# Patient Record
Sex: Female | Born: 1985 | Race: Black or African American | Hispanic: No | Marital: Single | Smoking: Former smoker
Health system: Southern US, Community
[De-identification: ages and names within clinical notes are randomized; demographics above are authoritative.]

## PROBLEM LIST (undated history)

## (undated) ENCOUNTER — Inpatient Hospital Stay (HOSPITAL_COMMUNITY): Payer: Self-pay

## (undated) DIAGNOSIS — R519 Headache, unspecified: Secondary | ICD-10-CM

## (undated) DIAGNOSIS — J45909 Unspecified asthma, uncomplicated: Secondary | ICD-10-CM

## (undated) DIAGNOSIS — E119 Type 2 diabetes mellitus without complications: Secondary | ICD-10-CM

## (undated) DIAGNOSIS — R51 Headache: Secondary | ICD-10-CM

## (undated) DIAGNOSIS — F4310 Post-traumatic stress disorder, unspecified: Secondary | ICD-10-CM

## (undated) HISTORY — PX: LAPAROSCOPIC GASTRIC SLEEVE RESECTION: SHX5895

## (undated) HISTORY — PX: GASTRIC BYPASS: SHX52

---
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
## 2006-04-29 IMAGING — CR NECK SOFT TISSUES - 1+ VIEW
1 series · 2 of 2 positions shown · non-contrast
Comparison: none

REASON FOR EXAM: neck pain/[HOSPITAL]
COMMENTS:

PROCEDURE:     DXR - DXR SOFT TISSUE NECK  - January 04, 2005 [DATE]
RESULT:
AP and lateral views of the neck obtained with soft tissue technique reveal
the epiglottis to be normal in size.  No retropharyngeal soft tissue
swelling is seen.  No foreign body is identified in the cervical airway.

[Series 1: view not recorded · 0.17mm/px · 2 of 2 slices shown]
[im 1/2]
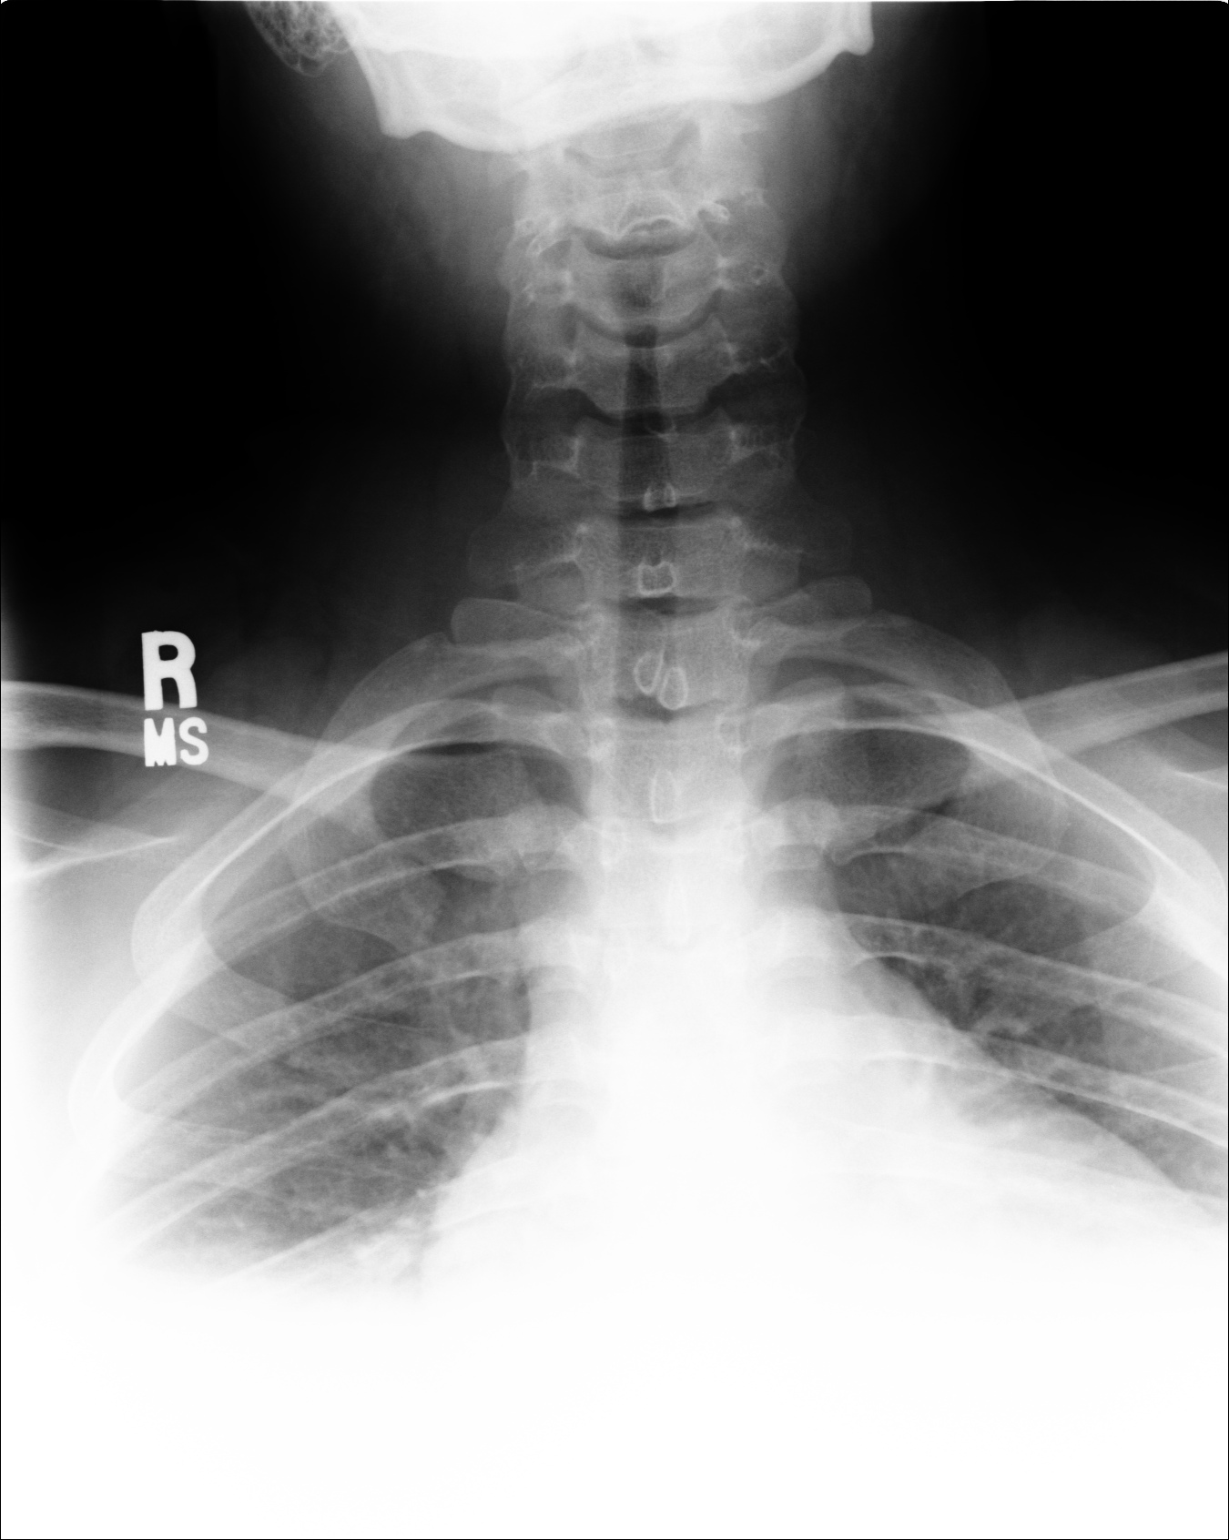
[im 2/2]
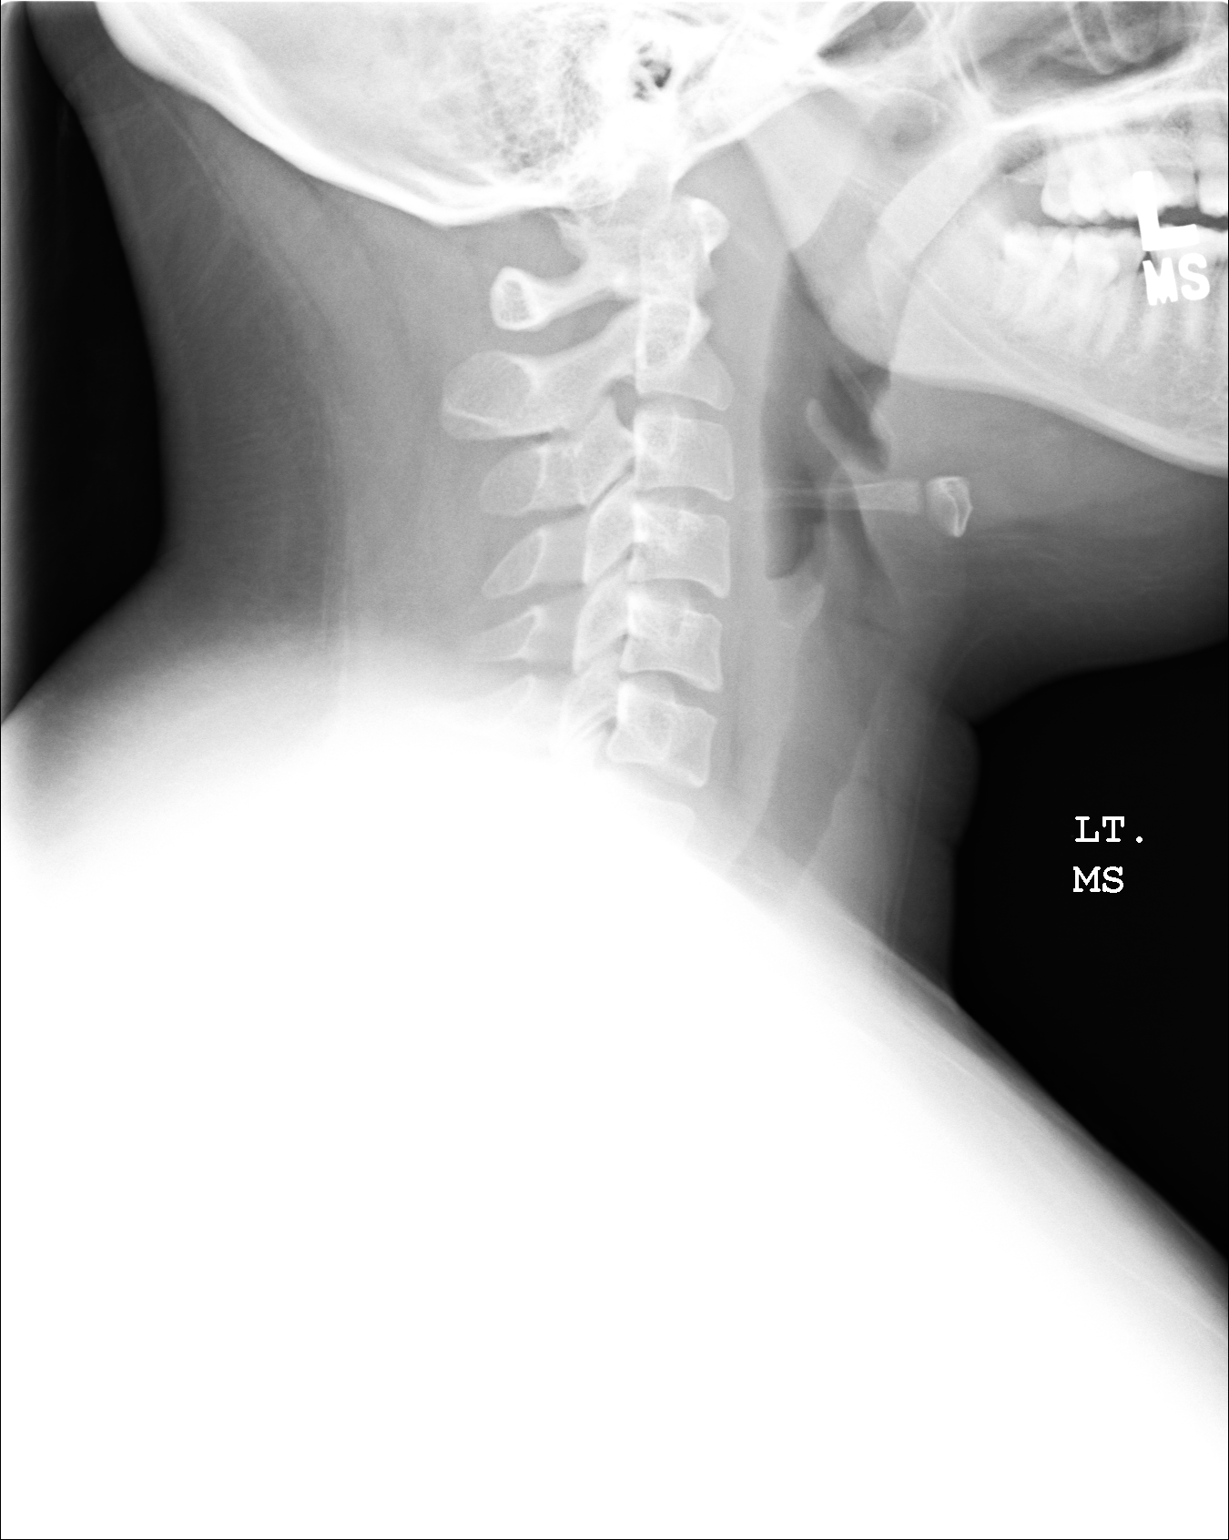

[2 of 2 positions shown; findings below may reference images not displayed]

IMPRESSION: No significant abnormalities noted.

## 2010-04-20 ENCOUNTER — Emergency Department: Payer: Self-pay | Admitting: Emergency Medicine

## 2011-11-24 ENCOUNTER — Emergency Department: Payer: Self-pay | Admitting: Internal Medicine

## 2012-10-16 ENCOUNTER — Emergency Department: Payer: Self-pay | Admitting: Internal Medicine

## 2013-10-06 ENCOUNTER — Ambulatory Visit: Payer: Self-pay | Admitting: Surgery

## 2013-11-04 ENCOUNTER — Ambulatory Visit: Payer: Self-pay | Admitting: Surgery

## 2016-08-14 ENCOUNTER — Emergency Department (HOSPITAL_COMMUNITY)
Admission: EM | Admit: 2016-08-14 | Discharge: 2016-08-14 | Disposition: A | Discharge status: Home / Self Care | Payer: Medicaid Other | Attending: Emergency Medicine | Admitting: Emergency Medicine

## 2016-08-14 ENCOUNTER — Encounter (HOSPITAL_COMMUNITY): Payer: Self-pay

## 2016-08-14 DIAGNOSIS — K0889 Other specified disorders of teeth and supporting structures: Secondary | ICD-10-CM | POA: Diagnosis present

## 2016-08-14 DIAGNOSIS — Z79899 Other long term (current) drug therapy: Secondary | ICD-10-CM | POA: Insufficient documentation

## 2016-08-14 DIAGNOSIS — K029 Dental caries, unspecified: Secondary | ICD-10-CM

## 2016-08-14 DIAGNOSIS — K047 Periapical abscess without sinus: Secondary | ICD-10-CM

## 2016-08-14 HISTORY — DX: Post-traumatic stress disorder, unspecified: F43.10

## 2016-08-14 MED ORDER — IBUPROFEN 600 MG PO TABS: 600.0000 mg | ORAL_TABLET | Freq: Four times a day (QID) | ORAL | 0 refills | Status: DC | PRN

## 2016-08-14 MED ORDER — HYDROCODONE-ACETAMINOPHEN 5-325 MG PO TABS
1.0000 | ORAL_TABLET | Freq: Once | ORAL | Status: AC
  Administered 2016-08-14: 1 via ORAL
  Filled 2016-08-14: qty 1

## 2016-08-14 MED ORDER — PENICILLIN V POTASSIUM 500 MG PO TABS: 500.0000 mg | ORAL_TABLET | Freq: Four times a day (QID) | ORAL | 0 refills | Status: AC

## 2016-08-14 MED ORDER — PENICILLIN V POTASSIUM 250 MG PO TABS
500.0000 mg | ORAL_TABLET | Freq: Once | ORAL | Status: AC
  Administered 2016-08-14: 500 mg via ORAL
  Filled 2016-08-14: qty 2

## 2016-08-14 MED ORDER — ACETAMINOPHEN 500 MG PO TABS: 1000.0000 mg | ORAL_TABLET | Freq: Three times a day (TID) | ORAL | 0 refills | Status: AC

## 2016-08-14 NOTE — ED Triage Notes (Signed)
Per pT, pt noted pain in swelling in her right mouth due to a tooth that has a hole in it. Reports increasing pain since yesterday. No fevers.

## 2016-08-14 NOTE — ED Provider Notes (Signed)
[facility] DEPT Provider Note   CSN: [id number] Arrival date & time: 08/14/16  1844   By signing my name below, I, Clovis Pu, attest that this documentation has been prepared under the direction and in the presence of Nira Conn, MD  Electronically Signed: Clovis Pu, ED Scribe. 08/14/16. 7:44 PM.   History   Chief Complaint Chief Complaint  Patient presents with  . Dental Pain    HPI Comments:  Sara Mullins is a 31 y.o. female who presents to the Emergency Department complaining of acute onset, moderate right sided dental pain x yesterday. Her pain is worse with palpation. She also reports right sided facial swelling and states her pain radiates to her right ear. She has applied warm compresses and has been taking Tylenol with no relief. Pt denies any other associated symptoms. Pt is not followed by a dentist. No other complaints noted.   The history is provided by the patient. No language interpreter was used.  Dental Pain   This is a new problem. The current episode started yesterday. The problem occurs constantly. The problem has not changed since onset.The pain is moderate. She has tried acetaminophen and heat for the symptoms. The treatment provided no relief.    Past Medical History:  Diagnosis Date  . PTSD (post-traumatic stress disorder)     There are no active problems to display for this patient.   Past Surgical History:  Procedure Laterality Date  . GASTRIC BYPASS    . LAPAROSCOPIC GASTRIC SLEEVE RESECTION      OB History    No data available       Home Medications    Prior to Admission medications   Medication Sig Start Date End Date Taking? Authorizing Provider  acetaminophen (TYLENOL) 500 MG tablet Take 2 tablets (1,000 mg total) by mouth every 8 (eight) hours. Do not take more than 4000 mg of acetaminophen (Tylenol) in a 24-hour period. Please note that other medicines that you may be prescribed may have Tylenol as well. 08/14/16  08/19/16  Nira Conn, MD  ibuprofen (ADVIL,MOTRIN) 600 MG tablet Take 1 tablet (600 mg total) by mouth every 6 (six) hours as needed. 08/14/16   Nira Conn, MD  penicillin v potassium (VEETID) 500 MG tablet Take 1 tablet (500 mg total) by mouth 4 (four) times daily. 08/14/16 08/21/16  Nira Conn, MD    Family History No family history on file.  Social History Social History  Substance Use Topics  . Smoking status: Never Smoker  . Smokeless tobacco: Never Used  . Alcohol use No     Allergies   Patient has no known allergies.   Review of Systems Review of Systems All other systems reviewed and are negative for acute change except as noted in the HPI.  Physical Exam Updated Vital Signs BP 123/88 (BP Location: Left Arm)   Pulse 98   Temp 98.7 F (37.1 C) (Oral)   Resp 18   Ht  (1.6 m)   Wt 201 lb (91.2 kg)   LMP 08/12/2016   SpO2 99%   BMI 35.61 kg/m   Physical Exam  Constitutional: She is oriented to person, place, and time. She appears well-developed and well-nourished. No distress.  HENT:  Head: Normocephalic and atraumatic.  Right Ear: External ear normal.  Left Ear: External ear normal.  Nose: Nose normal.  Mouth/Throat: No trismus in the jaw. Abnormal dentition. Dental caries present. No dental abscesses or uvula swelling. No posterior oropharyngeal  edema or posterior oropharyngeal erythema.    No submandibular tenderness or swelling  Eyes: Conjunctivae and EOM are normal. No scleral icterus.  Neck: Normal range of motion and phonation normal.  Cardiovascular: Normal rate and regular rhythm.   Pulmonary/Chest: Effort normal. No stridor. No respiratory distress.  Abdominal: She exhibits no distension.  Musculoskeletal: Normal range of motion. She exhibits no edema.  Neurological: She is alert and oriented to person, place, and time.  Skin: She is not diaphoretic.  Psychiatric: She has a normal mood and affect. Her behavior  is normal.  Vitals reviewed.    ED Treatments / Results  DIAGNOSTIC STUDIES:  Oxygen Saturation is 99% on RA, normal by my interpretation.    COORDINATION OF CARE:  7:44 PM Discussed treatment plan with pt at bedside and pt agreed to plan.  Labs (all labs ordered are listed, but only abnormal results are displayed) Labs Reviewed - No data to display  EKG  EKG Interpretation None       Radiology No results found.  Procedures Procedures (including critical care time)  Medications Ordered in ED Medications  HYDROcodone-acetaminophen (NORCO/VICODIN) 5-325 MG per tablet 1 tablet (not administered)  penicillin v potassium (VEETID) tablet 500 mg (not administered)     Initial Impression / Assessment and Plan / ED Course  I have reviewed the triage vital signs and the nursing notes.  Pertinent labs & imaging results that were available during my care of the patient were reviewed by me and considered in my medical decision making (see chart for details).     Consistent with pulpitis. No evidence of periodontal abscess or deep tissue abscess such as Ludwig's. Provided with by mouth pain medicine and first dose of Pen-Vee K. Patient also provided with resources for local dentistry. Instructed to follow up closely with dentistry for further management.  The patient is safe for discharge with strict return precautions.   Final Clinical Impressions(s) / ED Diagnoses   Final diagnoses:  Infected dental caries   Disposition: Discharge  Condition: Good  I have discussed the results, Dx and Tx plan with the patient who expressed understanding and agree(s) with the plan. Discharge instructions discussed at great length. The patient was given strict return precautions who verbalized understanding of the instructions. No further questions at time of discharge.    New Prescriptions   ACETAMINOPHEN (TYLENOL) 500 MG TABLET    Take 2 tablets (1,000 mg total) by mouth every 8  (eight) hours. Do not take more than 4000 mg of acetaminophen (Tylenol) in a 24-hour period. Please note that other medicines that you may be prescribed may have Tylenol as well.   IBUPROFEN (ADVIL,MOTRIN) 600 MG TABLET    Take 1 tablet (600 mg total) by mouth every 6 (six) hours as needed.   PENICILLIN V POTASSIUM (VEETID) 500 MG TABLET    Take 1 tablet (500 mg total) by mouth 4 (four) times daily.    Follow Up: No follow-up provider specified.  I personally performed the services described in this documentation, which was scribed in my presence. The recorded information has been reviewed and is accurate.        Nira Conn, MD 08/14/16 2025

## 2016-09-19 ENCOUNTER — Encounter (HOSPITAL_COMMUNITY): Payer: Self-pay | Admitting: Emergency Medicine

## 2016-09-19 ENCOUNTER — Emergency Department (HOSPITAL_COMMUNITY)
Admission: EM | Admit: 2016-09-19 | Discharge: 2016-09-20 | Disposition: A | Discharge status: Home / Self Care | Payer: Medicaid Other | Attending: Emergency Medicine | Admitting: Emergency Medicine

## 2016-09-19 DIAGNOSIS — O9989 Other specified diseases and conditions complicating pregnancy, childbirth and the puerperium: Secondary | ICD-10-CM | POA: Diagnosis present

## 2016-09-19 DIAGNOSIS — M5432 Sciatica, left side: Secondary | ICD-10-CM | POA: Insufficient documentation

## 2016-09-19 DIAGNOSIS — J45909 Unspecified asthma, uncomplicated: Secondary | ICD-10-CM | POA: Diagnosis not present

## 2016-09-19 DIAGNOSIS — Z3A01 Less than 8 weeks gestation of pregnancy: Secondary | ICD-10-CM | POA: Diagnosis not present

## 2016-09-19 DIAGNOSIS — O0281 Inappropriate change in quantitative human chorionic gonadotropin (hCG) in early pregnancy: Secondary | ICD-10-CM | POA: Insufficient documentation

## 2016-09-19 HISTORY — DX: Unspecified asthma, uncomplicated: J45.909

## 2016-09-19 LAB — CBC WITH DIFFERENTIAL/PLATELET
BASOS ABS: 0 10*3/uL (ref 0.0–0.1)
BASOS PCT: 0 %
Eosinophils Absolute: 0.1 10*3/uL (ref 0.0–0.7)
Eosinophils Relative: 1 %
HEMATOCRIT: 34.6 % — AB (ref 36.0–46.0)
Hemoglobin: 11.4 g/dL — ABNORMAL LOW (ref 12.0–15.0)
LYMPHS PCT: 63 %
Lymphs Abs: 4.6 10*3/uL — ABNORMAL HIGH (ref 0.7–4.0)
MCH: 26.3 pg (ref 26.0–34.0)
MCHC: 32.9 g/dL (ref 30.0–36.0)
MCV: 79.9 fL (ref 78.0–100.0)
Monocytes Absolute: 0.3 10*3/uL (ref 0.1–1.0)
Monocytes Relative: 4 %
NEUTROS ABS: 2.4 10*3/uL (ref 1.7–7.7)
NEUTROS PCT: 32 %
Platelets: 421 10*3/uL — ABNORMAL HIGH (ref 150–400)
RBC: 4.33 MIL/uL (ref 3.87–5.11)
RDW: 15.2 % (ref 11.5–15.5)
WBC: 7.3 10*3/uL (ref 4.0–10.5)

## 2016-09-19 LAB — BASIC METABOLIC PANEL
ANION GAP: 7 (ref 5–15)
BUN: 5 mg/dL — ABNORMAL LOW (ref 6–20)
CO2: 20 mmol/L — AB (ref 22–32)
CREATININE: 0.61 mg/dL (ref 0.44–1.00)
Calcium: 9 mg/dL (ref 8.9–10.3)
Chloride: 109 mmol/L (ref 101–111)
GFR calc non Af Amer: >60 mL/min (ref >60)
Glucose, Bld: 104 mg/dL — ABNORMAL HIGH (ref 65–99)
Potassium: 3.6 mmol/L (ref 3.5–5.1)
SODIUM: 136 mmol/L (ref 135–145)

## 2016-09-19 LAB — URINALYSIS, ROUTINE W REFLEX MICROSCOPIC
Bilirubin Urine: NEGATIVE
Glucose, UA: 50 mg/dL — AB
HGB URINE DIPSTICK: NEGATIVE
Ketones, ur: NEGATIVE mg/dL
LEUKOCYTES UA: NEGATIVE
NITRITE: NEGATIVE
Protein, ur: NEGATIVE mg/dL
SPECIFIC GRAVITY, URINE: 1.02 (ref 1.005–1.030)
pH: 5 (ref 5.0–8.0)

## 2016-09-19 LAB — HCG, QUANTITATIVE, PREGNANCY: hCG, Beta Chain, Quant, S: 114 m[IU]/mL — ABNORMAL HIGH (ref <5)

## 2016-09-19 LAB — POC URINE PREG, ED: PREG TEST UR: POSITIVE — AB

## 2016-09-19 NOTE — ED Provider Notes (Signed)
[facility] Provider Note   CSN: [id number] Arrival date & time: 09/19/16  4098  By signing my name below, I, Phillips Climes, attest that this documentation has been prepared under the direction and in the presence of [name], NP. Electronically Signed: Phillips Climes, Scribe. 09/20/2016. 12:06 AM.   History   Chief Complaint Chief Complaint  Patient presents with  . Back Pain   HPI Sara Mullins is a 31 y.o. female with a PMHx consisting of obesity, who presents to the Emergency Department with complaints of constant left sided abdominal pain, with radiation to her back. Pt's sx unrelieved by Tylenol.   Pt denies experiencing any other acute sx, including increase in urinary frequency, burning, dysuria, vaginal bleeding or vaginal discharge.   The history is provided by the patient. No language interpreter was used.    Past Medical History:  Diagnosis Date  . Asthma   . PTSD (post-traumatic stress disorder)     There are no active problems to display for this patient.   Past Surgical History:  Procedure Laterality Date  . GASTRIC BYPASS    . LAPAROSCOPIC GASTRIC SLEEVE RESECTION      OB History    Gravida Para Term Preterm AB Living   1             SAB TAB Ectopic Multiple Live Births                   Home Medications    Prior to Admission medications   Not on File    Family History No family history on file.  Social History Social History  Substance Use Topics  . Smoking status: Never Smoker  . Smokeless tobacco: Never Used  . Alcohol use No     Allergies   Patient has no known allergies.   Review of Systems Review of Systems  Constitutional: Negative for fever.  Respiratory: Negative for shortness of breath.   Gastrointestinal: Positive for abdominal pain. Negative for nausea and vomiting.  Genitourinary: Negative for dysuria, frequency, vaginal bleeding and vaginal discharge.  Musculoskeletal: Positive for back pain.    Skin: Negative for rash.  Psychiatric/Behavioral: The patient is not nervous/anxious.     Physical Exam Updated Vital Signs BP 130/88   Pulse 95   Temp 99.4 F (37.4 C) (Oral)   Resp 17   SpO2 98%   Physical Exam  Constitutional: No distress.  obese  HENT:  Head: Normocephalic and atraumatic.  Mouth/Throat: Oropharynx is clear and moist.  Eyes: Pupils are equal, round, and reactive to light.  Neck: Neck supple.  Cardiovascular: Normal rate and regular rhythm.   Pulmonary/Chest: Effort normal and breath sounds normal.  Abdominal: Soft. Bowel sounds are normal. There is tenderness. There is no rebound and no guarding.  Mild LLQ  Genitourinary:  Genitourinary Comments: Female RN chaperone present throughout entire exam. External genitalia without any lesions. Thick, white cheesy discharge in the vaginal vault. Cervix closed. No adnexal tenderness or mass. Uterus without palatable enlargement.  Musculoskeletal: Normal range of motion. She exhibits no edema.  Tenderness to left sciatic nerve.  Neurological: She is alert.  Skin: Skin is warm and dry. Capillary refill takes less than 2 seconds. No rash noted. No erythema.  Psychiatric: She has a normal mood and affect. Her behavior is normal.  Nursing note and vitals reviewed.   ED Treatments / Results  DIAGNOSTIC STUDIES: Oxygen Saturation is 92% on RA, nl by my interpretation.  COORDINATION OF CARE: 12:06 AM Discussed treatment plan with pt at bedside and pt agreed to plan. [facility] Discussed strict return precautions with pt, who verbalized understanding.   Labs (all labs ordered are listed, but only abnormal results are displayed) Labs Reviewed  WET PREP, GENITAL - Abnormal; Notable for the following:       Result Value   WBC, Wet Prep HPF POC RARE (*)    All other components within normal limits  URINALYSIS, ROUTINE W REFLEX MICROSCOPIC - Abnormal; Notable for the following:    Glucose,  UA 50 (*)    All other components within normal limits  CBC WITH DIFFERENTIAL/PLATELET - Abnormal; Notable for the following:    Hemoglobin 11.4 (*)    HCT 34.6 (*)    Platelets 421 (*)    Lymphs Abs 4.6 (*)    All other components within normal limits  HCG, QUANTITATIVE, PREGNANCY - Abnormal; Notable for the following:    hCG, Beta Chain, Quant, S 114 (*)    All other components within normal limits  BASIC METABOLIC PANEL - Abnormal; Notable for the following:    CO2 20 (*)    Glucose, Bld 104 (*)    BUN 5 (*)    All other components within normal limits  POC URINE PREG, ED - Abnormal; Notable for the following:    Preg Test, Ur POSITIVE (*)    All other components within normal limits  RPR  HIV ANTIBODY (ROUTINE TESTING)  ABO/RH  GC/CHLAMYDIA PROBE AMP ([place]) NOT AT [facility]    Radiology No results found.  Procedures Procedures (including critical care time)  Medications Ordered in ED Medications - No data to display   Initial Impression / Assessment and Plan / ED Course  I have reviewed the triage vital signs and the nursing notes.  Pertinent labs & imaging results that were available during my care of the patient were reviewed by me and considered in my medical decision making (see chart for details).  Patient is nontoxic, nonseptic appearing, in no apparent distress.  Patient's pain and other symptoms adequately managed in emergency department. Imimaging and vitals reviewed.   On repeat exam patient does not have a surgical abdomin and there are no peritoneal signs.  No indication of appendicitis, bowel obstruction, bowel perforation, cholecystitis, diverticulitis. Due to the low Bhcg and mild pain ectopic pregnancy can not be ruled out at this time. Close follow up required with [facility] hours for repeat Bhcg or sooner for worsening symptoms. I have also discussed reasons to return immediately to the ER.  Patient expresses understanding  and agrees with plan.   Final Clinical Impressions(s) / ED Diagnoses   Final diagnoses:  Sciatica of left side  Less than [redacted] weeks gestation of pregnancy    New Prescriptions Discharge Medication List as of 09/20/2016 12:18 AM    I personally performed the services described in this documentation, which was scribed in my presence. The recorded information has been reviewed and is accurate.    Kerrie Buffaloeese, Hope RichardsM, TexasNP 09/22/16 1645    Marily MemosMesner, Jason, MD 09/22/16 1705

## 2016-09-19 NOTE — ED Triage Notes (Signed)
Patient reports low back pain onset last night , denies injury or fall , ambulatory , no urinary discomfort or hematuria .

## 2016-09-20 LAB — WET PREP, GENITAL
Clue Cells Wet Prep HPF POC: NONE SEEN
SPERM: NONE SEEN
Trich, Wet Prep: NONE SEEN
Yeast Wet Prep HPF POC: NONE SEEN

## 2016-09-20 LAB — HIV ANTIBODY (ROUTINE TESTING W REFLEX): HIV SCREEN 4TH GENERATION: NONREACTIVE

## 2016-09-20 LAB — ABO/RH: ABO/RH(D): A POS

## 2016-09-20 LAB — RPR: RPR Ser Ql: NONREACTIVE

## 2016-09-20 LAB — GC/CHLAMYDIA PROBE AMP (~~LOC~~) NOT AT ARMC
Chlamydia: NEGATIVE
NEISSERIA GONORRHEA: NEGATIVE

## 2016-09-20 NOTE — ED Notes (Signed)
Pt verbalized understanding discharge instructions and denies any further needs or questions at this time. VS stable, ambulatory and steady gait.   

## 2016-09-20 NOTE — Discharge Instructions (Signed)
Take tylenol as needed for pain. Follow [facility] on Saturday night to repeat the pregnancy hormone level. If you develop abdominal pain, vaginal bleeding feeling weak and dizzy or other problems go to [facility] department (called Maternity Admissions) immediately.

## 2016-09-22 ENCOUNTER — Encounter (HOSPITAL_COMMUNITY): Payer: Self-pay | Admitting: *Deleted

## 2016-09-22 ENCOUNTER — Inpatient Hospital Stay (HOSPITAL_COMMUNITY)
Admission: AD | Admit: 2016-09-22 | Discharge: 2016-09-22 | Disposition: A | Discharge status: Home / Self Care | Payer: Medicaid Other | Source: Ambulatory Visit | Attending: Obstetrics and Gynecology | Admitting: Obstetrics and Gynecology

## 2016-09-22 DIAGNOSIS — Z3491 Encounter for supervision of normal pregnancy, unspecified, first trimester: Secondary | ICD-10-CM | POA: Diagnosis not present

## 2016-09-22 DIAGNOSIS — Z3490 Encounter for supervision of normal pregnancy, unspecified, unspecified trimester: Secondary | ICD-10-CM

## 2016-09-22 NOTE — MAU Provider Note (Signed)
History   G1 early preg was seen at [facility] on 09/19/16 for back pain quant at that time was 114. Was instructed to f/u with us today. Denies any pain or vag bleeding.  CSN: [id number]  Arrival date & time 09/22/16  1418   None     No chief complaint on file.   HPI  Past Medical History:  Diagnosis Date  . Asthma   . PTSD (post-traumatic stress disorder)     Past Surgical History:  Procedure Laterality Date  . GASTRIC BYPASS    . LAPAROSCOPIC GASTRIC SLEEVE RESECTION      No family history on file.  Social History  Substance Use Topics  . Smoking status: Never Smoker  . Smokeless tobacco: Never Used  . Alcohol use No    OB History    Gravida Para Term Preterm AB Living   1             SAB TAB Ectopic Multiple Live Births                  Review of Systems  Constitutional: Negative.   HENT: Negative.   Eyes: Negative.   Respiratory: Negative.   Cardiovascular: Negative.   Gastrointestinal: Negative.   Endocrine: Negative.   Genitourinary: Negative.   Musculoskeletal: Positive for back pain.  Skin: Negative.     Allergies  Patient has no known allergies.  Home Medications    BP 123/75 (BP Location: Right Arm)   Pulse 96   Temp 98.5 F (36.9 C) (Oral)   Resp 18   Ht 5\' 3"  (1.6 m)   Wt 233 lb (105.7 kg)   LMP 08/12/2016   SpO2 100%   BMI 41.27 kg/m   Physical Exam  Constitutional: She is oriented to person, place, and time.  HENT:  Head: Normocephalic.  Pulmonary/Chest: Effort normal.  Abdominal: Soft.  Neurological: She is alert and oriented to person, place, and time. She has normal reflexes.  Skin: Skin is warm and dry.  Psychiatric: She has a normal mood and affect. Her behavior is normal. Judgment and thought content normal.    MAU Course  Procedures (including critical care time)  Labs Reviewed - No data to display No results found.   1. Early stage of pregnancy       MDM  VSS, discussed with pt that we would not be  able to see IUP at this early stage that quant would have to be above 1000. Pt verbalized understanding. Info give with provider numbers and pt to f/u with us prn ant further problems.

## 2016-09-22 NOTE — MAU Note (Signed)
Pt reports she was seen at the [facility] 2 days ago for back pain and she found out she was preg and they told her the "levels were too low " so she needed to come here today.

## 2016-10-24 ENCOUNTER — Inpatient Hospital Stay (HOSPITAL_COMMUNITY)
Admission: AD | Admit: 2016-10-24 | Discharge: 2016-10-24 | Disposition: A | Discharge status: Home / Self Care | Payer: Medicaid Other | Source: Ambulatory Visit | Attending: Family Medicine | Admitting: Family Medicine

## 2016-10-24 ENCOUNTER — Encounter (HOSPITAL_COMMUNITY): Payer: Self-pay | Admitting: *Deleted

## 2016-10-24 DIAGNOSIS — R42 Dizziness and giddiness: Secondary | ICD-10-CM | POA: Diagnosis not present

## 2016-10-24 DIAGNOSIS — O219 Vomiting of pregnancy, unspecified: Secondary | ICD-10-CM | POA: Diagnosis not present

## 2016-10-24 DIAGNOSIS — O99341 Other mental disorders complicating pregnancy, first trimester: Secondary | ICD-10-CM | POA: Diagnosis present

## 2016-10-24 DIAGNOSIS — Z3A10 10 weeks gestation of pregnancy: Secondary | ICD-10-CM | POA: Diagnosis present

## 2016-10-24 DIAGNOSIS — O99511 Diseases of the respiratory system complicating pregnancy, first trimester: Secondary | ICD-10-CM | POA: Diagnosis not present

## 2016-10-24 DIAGNOSIS — O3680X0 Pregnancy with inconclusive fetal viability, not applicable or unspecified: Secondary | ICD-10-CM

## 2016-10-24 LAB — COMPREHENSIVE METABOLIC PANEL
ALBUMIN: 3.5 g/dL (ref 3.5–5.0)
ALT: 18 U/L (ref 14–54)
AST: 19 U/L (ref 15–41)
Alkaline Phosphatase: 27 U/L — ABNORMAL LOW (ref 38–126)
Anion gap: 5 (ref 5–15)
BUN: 10 mg/dL (ref 6–20)
CALCIUM: 9.2 mg/dL (ref 8.9–10.3)
CO2: 23 mmol/L (ref 22–32)
CREATININE: 0.71 mg/dL (ref 0.44–1.00)
Chloride: 107 mmol/L (ref 101–111)
GFR calc Af Amer: >60 mL/min (ref >60)
GFR calc non Af Amer: >60 mL/min (ref >60)
GLUCOSE: 86 mg/dL (ref 65–99)
Potassium: 4.4 mmol/L (ref 3.5–5.1)
SODIUM: 135 mmol/L (ref 135–145)
Total Bilirubin: 0.3 mg/dL (ref 0.3–1.2)
Total Protein: 7.3 g/dL (ref 6.5–8.1)

## 2016-10-24 LAB — URINALYSIS, ROUTINE W REFLEX MICROSCOPIC
Bilirubin Urine: NEGATIVE
Glucose, UA: NEGATIVE mg/dL
Hgb urine dipstick: NEGATIVE
KETONES UR: NEGATIVE mg/dL
Leukocytes, UA: NEGATIVE
Nitrite: NEGATIVE
PROTEIN: 30 mg/dL — AB
Specific Gravity, Urine: 1.03 (ref 1.005–1.030)
pH: 5 (ref 5.0–8.0)

## 2016-10-24 LAB — CBC
HCT: 33 % — ABNORMAL LOW (ref 36.0–46.0)
HEMOGLOBIN: 11 g/dL — AB (ref 12.0–15.0)
MCH: 26.8 pg (ref 26.0–34.0)
MCHC: 33.3 g/dL (ref 30.0–36.0)
MCV: 80.3 fL (ref 78.0–100.0)
Platelets: 428 10*3/uL — ABNORMAL HIGH (ref 150–400)
RBC: 4.11 MIL/uL (ref 3.87–5.11)
RDW: 15.9 % — ABNORMAL HIGH (ref 11.5–15.5)
WBC: 6 10*3/uL (ref 4.0–10.5)

## 2016-10-24 MED ORDER — METOCLOPRAMIDE HCL 10 MG PO TABS
10.0000 mg | ORAL_TABLET | Freq: Once | ORAL | Status: AC
  Administered 2016-10-24: 10 mg via ORAL
  Filled 2016-10-24: qty 1

## 2016-10-24 MED ORDER — CONCEPT DHA 53.5-38-1 MG PO CAPS: 1.0000 | ORAL_CAPSULE | Freq: Every day | ORAL | 2 refills | Status: DC

## 2016-10-24 MED ORDER — METOCLOPRAMIDE HCL 10 MG PO TABS: 10.0000 mg | ORAL_TABLET | Freq: Three times a day (TID) | ORAL | 1 refills | Status: DC

## 2016-10-24 NOTE — MAU Note (Signed)
Pt C/O dizziness & weakness for the last week, unable to eat, feels like she can't move.  Has been vomiting up PNV, doesn't have meds for vomiting. Has had the vomiting since Sunday, denies diarrhea.  No pain or bleeding.  Pt very upset, crying in triage.

## 2016-10-24 NOTE — MAU Provider Note (Signed)
History   [id number]   Chief Complaint  Patient presents with  . Dizziness  . Emesis During Pregnancy    HPI Sara Mullins is a 31 y.o. female  G1P0000 at [redacted]w[redacted]d IUP here with report of vomiting that started this AM.  Unable to hold anything down.  No report of vaginal bleeding of abdominal pain.  Denies fever, body aches, or recent exposure to ill individuals.    Also reports generalized fatigue and dizziness upon standing.    Patient's last menstrual period was 08/12/2016.  OB History  Gravida Para Term Preterm AB Living  1 0 0 0 0 0  SAB TAB Ectopic Multiple Live Births  0 0 0 0 0    # Outcome Date GA Lbr Len/2nd Weight Sex Delivery Anes PTL Lv  1 Current               Past Medical History:  Diagnosis Date  . Asthma   . PTSD (post-traumatic stress disorder)     History reviewed. No pertinent family history.  Social History   Social History  . Marital status: Single    Spouse name: N/A  . Number of children: N/A  . Years of education: N/A   Social History Main Topics  . Smoking status: Never Smoker  . Smokeless tobacco: Never Used  . Alcohol use No  . Drug use: No  . Sexual activity: Not Currently     Comment: not since finding out pregnant   Other Topics Concern  . None   Social History Narrative  . None    No Known Allergies  No current facility-administered medications on file prior to encounter.    No current outpatient prescriptions on file prior to encounter.     Review of Systems  Constitutional: Positive for fatigue. Negative for chills and fever.  HENT: Negative for sore throat.   Respiratory: Negative for cough.   Gastrointestinal: Negative for abdominal pain and constipation.  Genitourinary: Negative for vaginal bleeding and vaginal discharge.  Neurological: Positive for dizziness.  All other systems reviewed and are negative.    Physical Exam   Vitals:   10/24/16 1232  BP: (!) 140/99  Pulse: (!) 111  Resp: 18  Temp: 98.4  F (36.9 C)  TempSrc: Oral  Weight: 236 lb (107 kg)  Height: 5\' 3"  (1.6 m)    Physical Exam  Constitutional: She is oriented to person, place, and time. She appears well-developed and well-nourished.  HENT:  Head: Normocephalic.  Mouth/Throat: Mucous membranes are not dry.  Neck: Normal range of motion. Neck supple.  Cardiovascular: Normal rate, regular rhythm and normal heart sounds.   Respiratory: Effort normal and breath sounds normal.  GI: There is no tenderness.  Genitourinary: No bleeding in the vagina. No vaginal discharge found.  Neurological: She is alert and oriented to person, place, and time. She has normal reflexes.  Skin: Skin is warm and dry. She is not diaphoretic.    MAU Course  Procedures  MDM Results for orders placed or performed during the hospital encounter of 10/24/16 (from the past 24 hour(s))  Urinalysis, Routine w reflex microscopic     Status: Abnormal   Collection Time: 10/24/16 12:35 PM  Result Value Ref Range   Color, Urine YELLOW YELLOW   APPearance HAZY (A) CLEAR   Specific Gravity, Urine 1.030 1.005 - 1.030   pH 5.0 5.0 - 8.0   Glucose, UA NEGATIVE NEGATIVE mg/dL   Hgb urine dipstick NEGATIVE NEGATIVE   Bilirubin  Urine NEGATIVE NEGATIVE   Ketones, ur NEGATIVE NEGATIVE mg/dL   Protein, ur 30 (A) NEGATIVE mg/dL   Nitrite NEGATIVE NEGATIVE   Leukocytes, UA NEGATIVE NEGATIVE   RBC / HPF 0-5 0 - 5 RBC/hpf   WBC, UA 0-5 0 - 5 WBC/hpf   Bacteria, UA RARE (A) NONE SEEN   Squamous Epithelial / LPF 6-30 (A) NONE SEEN   Mucous PRESENT   CBC     Status: Abnormal   Collection Time: 10/24/16  1:18 PM  Result Value Ref Range   WBC 6.0 4.0 - 10.5 K/uL   RBC 4.11 3.87 - 5.11 MIL/uL   Hemoglobin 11.0 (L) 12.0 - 15.0 g/dL   HCT 16.133.0 (L) 09.636.0 - 04.546.0 %   MCV 80.3 78.0 - 100.0 fL   MCH 26.8 26.0 - 34.0 pg   MCHC 33.3 30.0 - 36.0 g/dL   RDW 40.915.9 (H) 81.111.5 - 91.415.5 %   Platelets 428 (H) 150 - 400 K/uL  Comprehensive metabolic panel     Status: Abnormal    Collection Time: 10/24/16  1:18 PM  Result Value Ref Range   Sodium 135 135 - 145 mmol/L   Potassium 4.4 3.5 - 5.1 mmol/L   Chloride 107 101 - 111 mmol/L   CO2 23 22 - 32 mmol/L   Glucose, Bld 86 65 - 99 mg/dL   BUN 10 6 - 20 mg/dL   Creatinine, Ser 7.820.71 0.44 - 1.00 mg/dL   Calcium 9.2 8.9 - 95.610.3 mg/dL   Total Protein 7.3 6.5 - 8.1 g/dL   Albumin 3.5 3.5 - 5.0 g/dL   AST 19 15 - 41 U/L   ALT 18 14 - 54 U/L   Alkaline Phosphatase 27 (L) 38 - 126 U/L   Total Bilirubin 0.3 0.3 - 1.2 mg/dL   GFR calc non Af Amer >60 >60 mL/min   GFR calc Af Amer >60 >60 mL/min   Anion gap 5 5 - 15   Reglan 10 mg PO  Pt reports decrease in nausea after medication  Assessment and Plan  31 y.o. G1P0000 at 6741w3d per LMP Nausea and Vomiting in Pregnancy   Plan: Discharge home RX Reglan 10 mg TID prn nausea Schedule outpatient ultrasound (provider will call with results) RX [facility]  Marlis EdelsonKarim, Jezlyn Westerfield N, CNM 10/24/2016 2:48 PM

## 2016-11-13 ENCOUNTER — Other Ambulatory Visit: Payer: Self-pay | Admitting: *Deleted

## 2016-11-13 DIAGNOSIS — Z3400 Encounter for supervision of normal first pregnancy, unspecified trimester: Secondary | ICD-10-CM

## 2016-11-14 ENCOUNTER — Ambulatory Visit (HOSPITAL_COMMUNITY)
Admission: RE | Admit: 2016-11-14 | Discharge: 2016-11-14 | Disposition: A | Discharge status: Home / Self Care | Payer: Medicaid Other | Source: Ambulatory Visit | Attending: Family | Admitting: Family

## 2016-11-14 DIAGNOSIS — Z3A12 12 weeks gestation of pregnancy: Secondary | ICD-10-CM | POA: Diagnosis not present

## 2016-11-14 DIAGNOSIS — O3680X0 Pregnancy with inconclusive fetal viability, not applicable or unspecified: Secondary | ICD-10-CM

## 2016-11-14 DIAGNOSIS — Z3400 Encounter for supervision of normal first pregnancy, unspecified trimester: Secondary | ICD-10-CM | POA: Diagnosis present

## 2016-11-16 ENCOUNTER — Other Ambulatory Visit (HOSPITAL_COMMUNITY)
Admission: RE | Admit: 2016-11-16 | Discharge: 2016-11-16 | Disposition: A | Discharge status: Home / Self Care | Payer: Medicaid Other | Source: Ambulatory Visit | Attending: Obstetrics | Admitting: Obstetrics

## 2016-11-16 ENCOUNTER — Ambulatory Visit (INDEPENDENT_AMBULATORY_CARE_PROVIDER_SITE_OTHER): Payer: Medicaid Other | Admitting: Obstetrics

## 2016-11-16 ENCOUNTER — Encounter: Payer: Self-pay | Admitting: Obstetrics

## 2016-11-16 VITALS — BP 120/67 | HR 89 | Wt 236.1 lb

## 2016-11-16 DIAGNOSIS — Z3400 Encounter for supervision of normal first pregnancy, unspecified trimester: Secondary | ICD-10-CM | POA: Insufficient documentation

## 2016-11-16 DIAGNOSIS — Z3401 Encounter for supervision of normal first pregnancy, first trimester: Secondary | ICD-10-CM | POA: Diagnosis not present

## 2016-11-16 MED ORDER — CITRANATAL BLOOM 90-1 MG PO TABS: 1.0000 | ORAL_TABLET | Freq: Every day | ORAL | 4 refills | Status: DC

## 2016-11-16 NOTE — Progress Notes (Signed)
Subjective:  Sara Mullins is a 31 y.o. G1P0000 at 5533w2d being seen today for ongoing prenatal care.  She is currently monitored for the following issues for this low-risk pregnancy and has Supervision of normal first pregnancy, antepartum on her problem list.  Patient reports no complaints.  Contractions: Not present. Vag. Bleeding: None.   . Denies leaking of fluid.   The following portions of the patient's history were reviewed and updated as appropriate: allergies, current medications, past family history, past medical history, past social history, past surgical history and problem list. Problem list updated.  Objective:   Vitals:   11/16/16 0933  BP: 120/67  Pulse: 89  Weight: 236 lb 1.6 oz (107.1 kg)    Fetal Status: Fetal Heart Rate (bpm): 150         General:  Alert, oriented and cooperative. Patient is in no acute distress.  Skin: Skin is warm and dry. No rash noted.   Cardiovascular: Normal heart rate noted  Respiratory: Normal respiratory effort, no problems with respiration noted  Abdomen: Soft, gravid, appropriate for gestational age. Pain/Pressure: Absent     Pelvic:  Cvx:  Long / Closed  Extremities: Normal range of motion.  Edema: None  Mental Status: Normal mood and affect. Normal behavior. Normal judgment and thought content.   Urinalysis:      Assessment and Plan:  Pregnancy: G1P0000 at 9533w2d  1. Supervision of normal first pregnancy, antepartum Rx: - Cytology - PAP - Cervicovaginal ancillary only - Obstetric Panel, Including HIV - Hemoglobinopathy evaluation - Vitamin D (25 hydroxy) - Varicella zoster antibody, IgG - Culture, OB Urine - Prenatal-DSS-FeCb-FeGl-FA (CITRANATAL BLOOM) 90-1 MG TABS; Take 1 tablet by mouth daily before breakfast.  Dispense: 90 tablet; Refill: 4  Preterm labor symptoms and general obstetric precautions including but not limited to vaginal bleeding, contractions, leaking of fluid and fetal movement were reviewed in detail with  the patient. Please refer to After Visit Summary for other counseling recommendations.  No Follow-up on file.   Brock BadHarper, Aanchal Cope A, MDPatient ID: Sara Mullins, female   DOB: [birth year], 31 y.o.   MRN: [id number]

## 2016-11-16 NOTE — Progress Notes (Signed)
Patient is in the office for initial ob visit, denies pain. 

## 2016-11-19 LAB — URINE CULTURE, OB REFLEX

## 2016-11-19 LAB — CULTURE, OB URINE

## 2016-11-20 LAB — CERVICOVAGINAL ANCILLARY ONLY
BACTERIAL VAGINITIS: NEGATIVE
CANDIDA VAGINITIS: NEGATIVE
CHLAMYDIA, DNA PROBE: NEGATIVE
Neisseria Gonorrhea: NEGATIVE
Trichomonas: NEGATIVE

## 2016-11-20 LAB — CYTOLOGY - PAP
Diagnosis: NEGATIVE
HPV (WINDOPATH): NOT DETECTED

## 2016-11-22 ENCOUNTER — Other Ambulatory Visit: Payer: Self-pay | Admitting: Obstetrics

## 2016-11-22 DIAGNOSIS — E559 Vitamin D deficiency, unspecified: Secondary | ICD-10-CM

## 2016-11-22 LAB — OBSTETRIC PANEL, INCLUDING HIV
ANTIBODY SCREEN: NEGATIVE
BASOS: 0 %
Basophils Absolute: 0 10*3/uL (ref 0.0–0.2)
EOS (ABSOLUTE): 0.1 10*3/uL (ref 0.0–0.4)
EOS: 1 %
HEMOGLOBIN: 10.9 g/dL — AB (ref 11.1–15.9)
HIV SCREEN 4TH GENERATION: NONREACTIVE
Hematocrit: 33.1 % — ABNORMAL LOW (ref 34.0–46.6)
Hepatitis B Surface Ag: NEGATIVE
IMMATURE GRANULOCYTES: 0 %
Immature Grans (Abs): 0 10*3/uL (ref 0.0–0.1)
LYMPHS ABS: 2.9 10*3/uL (ref 0.7–3.1)
Lymphs: 48 %
MCH: 27.3 pg (ref 26.6–33.0)
MCHC: 32.9 g/dL (ref 31.5–35.7)
MCV: 83 fL (ref 79–97)
MONOS ABS: 0.5 10*3/uL (ref 0.1–0.9)
Monocytes: 8 %
NEUTROS ABS: 2.6 10*3/uL (ref 1.4–7.0)
NEUTROS PCT: 43 %
Platelets: 402 10*3/uL — ABNORMAL HIGH (ref 150–379)
RBC: 4 x10E6/uL (ref 3.77–5.28)
RDW: 16.7 % — ABNORMAL HIGH (ref 12.3–15.4)
RH TYPE: POSITIVE
RPR Ser Ql: NONREACTIVE
Rubella Antibodies, IGG: 1.24 index (ref >0.99)
WBC: 6.1 10*3/uL (ref 3.4–10.8)

## 2016-11-22 LAB — HEMOGLOBINOPATHY EVALUATION
HGB C: 0 %
HGB S: 0 %
HGB VARIANT: 0 %
Hemoglobin A2 Quantitation: 2.4 % (ref 1.8–3.2)
Hemoglobin F Quantitation: 0 % (ref 0.0–2.0)
Hgb A: 97.6 % (ref 96.4–98.8)

## 2016-11-22 LAB — VARICELLA ZOSTER ANTIBODY, IGG: VARICELLA: 3509 {index} (ref >165)

## 2016-11-22 LAB — VITAMIN D 25 HYDROXY (VIT D DEFICIENCY, FRACTURES): VIT D 25 HYDROXY: 16.1 ng/mL — AB (ref 30.0–100.0)

## 2016-11-22 MED ORDER — VITAMIN D 50 MCG (2000 UT) PO TABS: 2000.0000 [IU] | ORAL_TABLET | Freq: Every day | ORAL | 11 refills | Status: DC

## 2016-12-08 ENCOUNTER — Inpatient Hospital Stay (HOSPITAL_COMMUNITY)
Admission: AD | Admit: 2016-12-08 | Discharge: 2016-12-08 | Disposition: A | Discharge status: Home / Self Care | Payer: Medicaid Other | Source: Ambulatory Visit | Attending: Obstetrics and Gynecology | Admitting: Obstetrics and Gynecology

## 2016-12-08 ENCOUNTER — Encounter (HOSPITAL_COMMUNITY): Payer: Self-pay | Admitting: *Deleted

## 2016-12-08 DIAGNOSIS — E162 Hypoglycemia, unspecified: Secondary | ICD-10-CM

## 2016-12-08 DIAGNOSIS — R51 Headache: Secondary | ICD-10-CM | POA: Diagnosis not present

## 2016-12-08 DIAGNOSIS — R110 Nausea: Secondary | ICD-10-CM | POA: Insufficient documentation

## 2016-12-08 DIAGNOSIS — R531 Weakness: Secondary | ICD-10-CM | POA: Diagnosis present

## 2016-12-08 DIAGNOSIS — O26892 Other specified pregnancy related conditions, second trimester: Secondary | ICD-10-CM | POA: Diagnosis not present

## 2016-12-08 DIAGNOSIS — Z87891 Personal history of nicotine dependence: Secondary | ICD-10-CM | POA: Insufficient documentation

## 2016-12-08 DIAGNOSIS — O219 Vomiting of pregnancy, unspecified: Secondary | ICD-10-CM

## 2016-12-08 DIAGNOSIS — R55 Syncope and collapse: Secondary | ICD-10-CM

## 2016-12-08 DIAGNOSIS — Z3A15 15 weeks gestation of pregnancy: Secondary | ICD-10-CM | POA: Insufficient documentation

## 2016-12-08 DIAGNOSIS — R519 Headache, unspecified: Secondary | ICD-10-CM

## 2016-12-08 HISTORY — DX: Headache: R51

## 2016-12-08 HISTORY — DX: Headache, unspecified: R51.9

## 2016-12-08 LAB — URINALYSIS, ROUTINE W REFLEX MICROSCOPIC
Bilirubin Urine: NEGATIVE
Glucose, UA: 50 mg/dL — AB
Hgb urine dipstick: NEGATIVE
Ketones, ur: 5 mg/dL — AB
LEUKOCYTES UA: NEGATIVE
NITRITE: NEGATIVE
PH: 5 (ref 5.0–8.0)
Protein, ur: NEGATIVE mg/dL
SPECIFIC GRAVITY, URINE: 1.026 (ref 1.005–1.030)

## 2016-12-08 MED ORDER — ONDANSETRON 4 MG PO TBDP
4.0000 mg | ORAL_TABLET | Freq: Once | ORAL | Status: AC
  Administered 2016-12-08: 4 mg via ORAL
  Filled 2016-12-08: qty 1

## 2016-12-08 MED ORDER — ONDANSETRON 4 MG PO TBDP: 4.0000 mg | ORAL_TABLET | Freq: Three times a day (TID) | ORAL | 0 refills | Status: DC | PRN

## 2016-12-08 MED ORDER — BUTALBITAL-APAP-CAFFEINE 50-325-40 MG PO TABS
1.0000 | ORAL_TABLET | Freq: Once | ORAL | Status: AC
  Administered 2016-12-08: 1 via ORAL
  Filled 2016-12-08: qty 1

## 2016-12-08 NOTE — MAU Note (Signed)
Pt arrived EMS with c/o feeling dizzy at work and increased pelvic pressure. Went outside and vomited x 2.

## 2016-12-08 NOTE — MAU Provider Note (Signed)
History   G1 @ 15.3 wks was at work became weak, jittery and felt like she was going to faint. Diet review pt missed breakfast became weak at work ate some noodles and green beans but became sick and threw up.  CSN: [id number]  Arrival date & time 12/08/16  1756   None     Chief Complaint  Patient presents with  . Dizziness  . Emesis  . Pelvic Pain    HPI  Past Medical History:  Diagnosis Date  . Asthma   . Headache   . PTSD (post-traumatic stress disorder)   . PTSD (post-traumatic stress disorder)     Past Surgical History:  Procedure Laterality Date  . GASTRIC BYPASS    . LAPAROSCOPIC GASTRIC SLEEVE RESECTION      Family History  Problem Relation Age of Onset  . Cancer Mother   . Cancer Maternal Grandmother     Social History  Substance Use Topics  . Smoking status: Former Games developermoker  . Smokeless tobacco: Never Used  . Alcohol use No    OB History    Gravida Para Term Preterm AB Living   1 0 0 0 0 0   SAB TAB Ectopic Multiple Live Births   0 0 0 0 0      Review of Systems  Constitutional: Positive for fatigue.  HENT: Negative.   Eyes: Negative.   Respiratory: Negative.   Cardiovascular: Negative.   Gastrointestinal: Positive for nausea and vomiting.  Endocrine: Negative.   Genitourinary: Negative.   Musculoskeletal: Negative.   Skin: Negative.   Allergic/Immunologic: Negative.   Neurological: Positive for syncope, weakness and light-headedness.  Hematological: Negative.   Psychiatric/Behavioral: Negative.     Allergies  Patient has no known allergies.  Home Medications    BP (!) 109/44   Pulse 84   Temp 98.4 F (36.9 C) (Oral)   Resp (!) 30   LMP 08/12/2016   SpO2 100%   Physical Exam  Constitutional: She is oriented to person, place, and time. She appears well-developed and well-nourished.  HENT:  Head: Normocephalic.  Eyes: Pupils are equal, round, and reactive to light.  Neck: Normal range of motion.  Cardiovascular: Normal  rate, regular rhythm, normal heart sounds and intact distal pulses.   Pulmonary/Chest: Effort normal and breath sounds normal.  Abdominal: Soft. Bowel sounds are normal.  Musculoskeletal: Normal range of motion.  Neurological: She is alert and oriented to person, place, and time. She has normal reflexes.  Skin: Skin is warm and dry.  Psychiatric: She has a normal mood and affect. Her behavior is normal. Judgment and thought content normal.    MAU Course  Procedures (including critical care time)  Labs Reviewed  URINALYSIS, ROUTINE W REFLEX MICROSCOPIC   No results found.   No diagnosis found.    MDM  VSS, CBG stable. FHR st and reg per doppler. Will treat nauseas and headache. Lengthy discussion with pt regarding protein in diet to avoid hypoglycemia. Pt states feels much better requesting d/c home

## 2016-12-08 NOTE — MAU Note (Signed)
Urine in lab 

## 2016-12-08 NOTE — Discharge Instructions (Signed)
Hypoglycemia Hypoglycemia occurs when the level of sugar (glucose) in the blood is too low. Glucose is a type of sugar that provides the body's main source of energy. Certain hormones (insulin and glucagon) control the level of glucose in the blood. Insulin lowers blood glucose, and glucagon increases blood glucose. Hypoglycemia can result from having too much insulin in the bloodstream, or from not eating enough food that contains glucose. Hypoglycemia can happen in people who do or do not have diabetes. It can develop quickly, and it can be a medical emergency. What are the causes? Hypoglycemia occurs most often in people who have diabetes. If you have diabetes, hypoglycemia may be caused by:  Diabetes medicine.  Not eating enough, or not eating often enough.  Increased physical activity.  Drinking alcohol, especially when you have not eaten recently.  If you do not have diabetes, hypoglycemia may be caused by:  A tumor in the pancreas. The pancreas is the organ that makes insulin.  Not eating enough, or not eating for long periods at a time (fasting).  Severe infection or illness that affects the liver, heart, or kidneys.  Certain medicines.  You may also have reactive hypoglycemia. This condition causes hypoglycemia within 4 hours of eating a meal. This may occur after having stomach surgery. Sometimes, the cause of reactive hypoglycemia is not known. What increases the risk? Hypoglycemia is more likely to develop in:  People who have diabetes and take medicines to lower blood glucose.  People who abuse alcohol.  People who have a severe illness.  What are the signs or symptoms? Hypoglycemia may not cause any symptoms. If you have symptoms, they may include:  Hunger.  Anxiety.  Sweating and feeling clammy.  Confusion.  Dizziness or feeling light-headed.  Sleepiness.  Nausea.  Increased heart rate.  Headache.  Blurry  vision.  Seizure.  Nightmares.  Tingling or numbness around the mouth, lips, or tongue.  A change in speech.  Decreased ability to concentrate.  A change in coordination.  Restless sleep.  Tremors or shakes.  Fainting.  Irritability.  How is this diagnosed? Hypoglycemia is diagnosed with a blood test to measure your blood glucose level. This blood test is done while you are having symptoms. Your health care provider may also do a physical exam and review your medical history. If you do not have diabetes, other tests may be done to find the cause of your hypoglycemia. How is this treated? This condition can often be treated by immediately eating or drinking something that contains glucose, such as:  3-4 sugar tablets (glucose pills).  Glucose gel, 15-gram tube.  Fruit juice, 4 oz (120 mL).  Regular soda (not diet soda), 4 oz (120 mL).  Low-fat milk, 4 oz (120 mL).  Several pieces of hard candy.  Sugar or honey, 1 Tbsp.  Treating Hypoglycemia If You Have Diabetes  If you are alert and able to swallow safely, follow the 15:15 rule:  Take 15 grams of a rapid-acting carbohydrate. Rapid-acting options include: ? 1 tube of glucose gel. ? 3 glucose pills. ? 6-8 pieces of hard candy. ? 4 oz (120 mL) of fruit juice. ? 4 oz (120 ml) of regular (not diet) soda.  Check your blood glucose 15 minutes after you take the carbohydrate.  If the repeat blood glucose level is still at or below 70 mg/dL (3.9 mmol/L), take 15 grams of a carbohydrate again.  If your blood glucose level does not increase above 70 mg/dL (3.9 mmol/L)  after 3 tries, seek emergency medical care.  After your blood glucose level returns to normal, eat a meal or a snack within 1 hour.  Treating Severe Hypoglycemia Severe hypoglycemia is when your blood glucose level is at or below 54 mg/dL (3 mmol/L). Severe hypoglycemia is an emergency. Do not wait to see if the symptoms will go away. Get medical help  right away. Call your local emergency services (911 in the [place] Do not drive yourself to the hospital. If you have severe hypoglycemia and you cannot eat or drink, you may need an injection of glucagon. A family member or close friend should learn how to check your blood glucose and how to give you a glucagon injection. Ask your health care provider if you need to have an emergency glucagon injection kit available. Severe hypoglycemia may need to be treated in a hospital. The treatment may include getting glucose through an IV tube. You may also need treatment for the cause of your hypoglycemia. Follow these instructions at home: General instructions  Avoid any diets that cause you to not eat enough food. Talk with your health care provider before you start any new diet.  Take over-the-counter and prescription medicines only as told by your health care provider.  Limit alcohol intake to no more than 1 drink per day for nonpregnant women and 2 drinks per day for men. One drink equals 12 oz of beer, 5 oz of wine, or 1 oz of hard liquor.  Keep all follow-up visits as told by your health care provider. This is important. If You Have Diabetes:   Make sure you know the symptoms of hypoglycemia.  Always have a rapid-acting carbohydrate snack with you to treat low blood sugar.  Follow your diabetes management plan, as told by your health care provider. Make sure you: ? Take your medicines as directed. ? Follow your exercise plan. ? Follow your meal plan. Eat on time, and do not skip meals. ? Check your blood glucose as often as directed. Make sure to check your blood glucose before and after exercise. If you exercise longer or in a different way than usual, check your blood glucose more often. ? Follow your sick day plan whenever you cannot eat or drink normally. Make this plan in advance with your health care provider.  Share your diabetes management plan with people in your workplace, school,  and household.  Check your urine for ketones when you are ill and as told by your health care provider.  Carry a medical alert card or wear medical alert jewelry. If You Have Reactive Hypoglycemia or Low Blood Sugar From Other Causes:  Monitor your blood glucose as told by your health care provider.  Follow instructions from your health care provider about eating or drinking restrictions. Contact a health care provider if:  You have problems keeping your blood glucose in your target range.  You have frequent episodes of hypoglycemia. Get help right away if:  You continue to have hypoglycemia symptoms after eating or drinking something containing glucose.  Your blood glucose is at or below 54 mg/dL (3 mmol/L).  You have a seizure.  You faint. These symptoms may represent a serious problem that is an emergency. Do not wait to see if the symptoms will go away. Get medical help right away. Call your local emergency services (911 in the [place] Do not drive yourself to the hospital. This information is not intended to replace advice given to you by your health care  frequent episodes of hypoglycemia.  Get help right away if:   You continue to have hypoglycemia symptoms after eating or drinking something containing glucose.   Your blood glucose is at or below 54 mg/dL (3 mmol/L).   You have a seizure.   You faint.  These symptoms may represent a serious problem that is an emergency. Do not wait to see if the symptoms will go away. Get medical help right away. Call your local emergency services (911 in the [place] Do not drive yourself to the hospital.  This information is not intended to replace advice given to you by your health care provider. Make sure you discuss any questions you have with your health care provider.  Document Released: 04/23/2005 Document Revised: 10/05/2015 Document Reviewed: 05/27/2015  Elsevier Interactive Patient Education  2018 [employer]

## 2016-12-11 ENCOUNTER — Other Ambulatory Visit: Payer: Self-pay | Admitting: Certified Nurse Midwife

## 2016-12-11 ENCOUNTER — Telehealth: Payer: Self-pay | Admitting: *Deleted

## 2016-12-11 DIAGNOSIS — O219 Vomiting of pregnancy, unspecified: Secondary | ICD-10-CM

## 2016-12-11 MED ORDER — DOXYLAMINE-PYRIDOXINE 10-10 MG PO TBEC: DELAYED_RELEASE_TABLET | ORAL | 4 refills | Status: DC

## 2016-12-11 MED ORDER — PROMETHAZINE HCL 12.5 MG PO TABS: 12.5000 mg | ORAL_TABLET | Freq: Four times a day (QID) | ORAL | 0 refills | Status: DC | PRN

## 2016-12-11 NOTE — Telephone Encounter (Signed)
Pt called to office stating she has not been feeling well. Had recent visit to ED.  Return call to pt. Pt states she has just not been feeling right. Pt states she has been feeling lightheaded and faint like.pt states she is having N&V, can keep some fluids down.  Pt would like to know if she should be seen today or keep appt for tomorrow. Pt made aware that she should try to keep something on her stomach at all times, eating snacks as tolerated.  Pt states she is eating crackers that are helping some.  Pt was advised to get Rx from pharmacy that she was prescribed from hospital visit.  Pt was also made aware she may try Ensure or Boost since she is not eating very much.  Pt advised that if she is unable to keep any fluids down she needs to be seen again at ED. Pt states that she did not go to work today and was advised if still feeling this bad to stay out tomorrow. Pt strongly advised to keep appt for tomorrow. Pt made aware plan can be discussed as well as work.  Reviewed with R.Denney, CNM Nausea medications were sent to pharmacy by her today. Pt to be made aware.  Attempt to contact pt, no answer rings busy.

## 2016-12-12 ENCOUNTER — Ambulatory Visit (INDEPENDENT_AMBULATORY_CARE_PROVIDER_SITE_OTHER): Payer: Medicaid Other | Admitting: Obstetrics

## 2016-12-12 VITALS — BP 121/72 | HR 112 | Wt 240.6 lb

## 2016-12-12 DIAGNOSIS — J45909 Unspecified asthma, uncomplicated: Secondary | ICD-10-CM | POA: Diagnosis not present

## 2016-12-12 DIAGNOSIS — O99519 Diseases of the respiratory system complicating pregnancy, unspecified trimester: Secondary | ICD-10-CM | POA: Diagnosis not present

## 2016-12-12 DIAGNOSIS — Z3402 Encounter for supervision of normal first pregnancy, second trimester: Secondary | ICD-10-CM

## 2016-12-12 DIAGNOSIS — Z3400 Encounter for supervision of normal first pregnancy, unspecified trimester: Secondary | ICD-10-CM

## 2016-12-13 ENCOUNTER — Telehealth: Payer: Self-pay

## 2016-12-13 ENCOUNTER — Encounter: Payer: Self-pay | Admitting: Obstetrics

## 2016-12-13 ENCOUNTER — Encounter: Payer: Self-pay | Admitting: *Deleted

## 2016-12-13 NOTE — Progress Notes (Signed)
Subjective:  Sara Mullins is a 31 y.o. G1P0000 at [redacted]w[redacted]d being seen today for ongoing prenatal care.  She is currently monitored for the following issues for this low-risk pregnancy and has Supervision of normal first pregnancy, antepartum on her problem list.  Patient reports occasional dizziness.  Contractions: Not present. Vag. Bleeding: None.   . Denies leaking of fluid.   The following portions of the patient's history were reviewed and updated as appropriate: allergies, current medications, past family history, past medical history, past social history, past surgical history and problem list. Problem list updated.  Objective:   Vitals:   12/13/16 0840  BP: 121/72  Pulse: (!) 112    Fetal Status: Fetal Heart Rate (bpm): 150         General:  Alert, oriented and cooperative. Patient is in no acute distress.  Skin: Skin is warm and dry. No rash noted.   Cardiovascular: Normal heart rate noted  Respiratory: Normal respiratory effort, no problems with respiration noted  Abdomen: Soft, gravid, appropriate for gestational age. Pain/Pressure: Present     Pelvic:  Deferred  Extremities: Normal range of motion.  Edema: Trace  Mental Status: Normal mood and affect. Normal behavior. Normal judgment and thought content.   Urinalysis:      Assessment and Plan:  Pregnancy: G1P0000 at [redacted]w[redacted]d  1. Supervision of normal first pregnancy, antepartum   2. Encounter for supervision of normal first pregnancy in second trimester Rx: - AFP TETRA  Preterm labor symptoms and general obstetric precautions including but not limited to vaginal bleeding, contractions, leaking of fluid and fetal movement were reviewed in detail with the patient. Please refer to After Visit Summary for other counseling recommendations.  Return in about 4 weeks (around 01/09/2017) for ROB.   Brock BadHarper, Charles A, MD

## 2016-12-13 NOTE — Telephone Encounter (Signed)
Called pt to advise that letter is ready for pick up, left vm to call.

## 2016-12-13 NOTE — Progress Notes (Signed)
12-12-16 Patient is in the office, complains of N&V, ocassional dizziness and light-headedness.

## 2016-12-14 MED ORDER — ALBUTEROL SULFATE HFA 108 (90 BASE) MCG/ACT IN AERS: 2.0000 | INHALATION_SPRAY | Freq: Four times a day (QID) | RESPIRATORY_TRACT | 2 refills | Status: AC | PRN

## 2016-12-14 NOTE — Addendum Note (Signed)
Addended by: Coral CeoHARPER, Zyir Gassert A on: 12/14/2016 11:21 AM   Modules accepted: Orders

## 2016-12-19 ENCOUNTER — Telehealth: Payer: Self-pay | Admitting: *Deleted

## 2016-12-19 NOTE — Telephone Encounter (Signed)
Error

## 2016-12-20 LAB — AFP TETRA
DIA Value (EIA): 130.2 pg/mL
GESTATIONAL AGE AFP: 16.1 wk
MSAFP: 29.2 ng/mL
MSHCG: 23038 m[IU]/mL
Maternal Age At EDD: 30.6 yr
UE3 VALUE: 0.55 ng/mL

## 2016-12-26 ENCOUNTER — Other Ambulatory Visit: Payer: Self-pay

## 2016-12-26 ENCOUNTER — Inpatient Hospital Stay (HOSPITAL_COMMUNITY)
Admission: AD | Admit: 2016-12-26 | Discharge: 2016-12-26 | Disposition: A | Discharge status: Home / Self Care | Payer: Medicaid Other | Source: Ambulatory Visit | Attending: Obstetrics and Gynecology | Admitting: Obstetrics and Gynecology

## 2016-12-26 ENCOUNTER — Encounter (HOSPITAL_COMMUNITY): Payer: Self-pay | Admitting: *Deleted

## 2016-12-26 DIAGNOSIS — O26892 Other specified pregnancy related conditions, second trimester: Secondary | ICD-10-CM | POA: Insufficient documentation

## 2016-12-26 DIAGNOSIS — R102 Pelvic and perineal pain: Secondary | ICD-10-CM | POA: Diagnosis present

## 2016-12-26 DIAGNOSIS — G43909 Migraine, unspecified, not intractable, without status migrainosus: Secondary | ICD-10-CM | POA: Insufficient documentation

## 2016-12-26 DIAGNOSIS — R109 Unspecified abdominal pain: Secondary | ICD-10-CM

## 2016-12-26 DIAGNOSIS — Z3400 Encounter for supervision of normal first pregnancy, unspecified trimester: Secondary | ICD-10-CM

## 2016-12-26 DIAGNOSIS — G43809 Other migraine, not intractable, without status migrainosus: Secondary | ICD-10-CM

## 2016-12-26 DIAGNOSIS — Z87891 Personal history of nicotine dependence: Secondary | ICD-10-CM | POA: Diagnosis not present

## 2016-12-26 DIAGNOSIS — R1083 Colic: Secondary | ICD-10-CM

## 2016-12-26 DIAGNOSIS — Z3A18 18 weeks gestation of pregnancy: Secondary | ICD-10-CM | POA: Insufficient documentation

## 2016-12-26 DIAGNOSIS — R1084 Generalized abdominal pain: Secondary | ICD-10-CM | POA: Diagnosis not present

## 2016-12-26 DIAGNOSIS — O26899 Other specified pregnancy related conditions, unspecified trimester: Secondary | ICD-10-CM

## 2016-12-26 HISTORY — DX: Type 2 diabetes mellitus without complications: E11.9

## 2016-12-26 LAB — URINALYSIS, ROUTINE W REFLEX MICROSCOPIC
Bilirubin Urine: NEGATIVE
Glucose, UA: 50 mg/dL — AB
Hgb urine dipstick: NEGATIVE
KETONES UR: NEGATIVE mg/dL
LEUKOCYTES UA: NEGATIVE
NITRITE: NEGATIVE
PROTEIN: NEGATIVE mg/dL
Specific Gravity, Urine: 1.021 (ref 1.005–1.030)
pH: 6 (ref 5.0–8.0)

## 2016-12-26 LAB — CBC WITH DIFFERENTIAL/PLATELET
BASOS ABS: 0 10*3/uL (ref 0.0–0.1)
BASOS PCT: 0 %
Eosinophils Absolute: 0.1 10*3/uL (ref 0.0–0.7)
Eosinophils Relative: 1 %
HEMATOCRIT: 29.7 % — AB (ref 36.0–46.0)
Hemoglobin: 10.4 g/dL — ABNORMAL LOW (ref 12.0–15.0)
LYMPHS PCT: 45 %
Lymphs Abs: 2.9 10*3/uL (ref 0.7–4.0)
MCH: 28.6 pg (ref 26.0–34.0)
MCHC: 35 g/dL (ref 30.0–36.0)
MCV: 81.6 fL (ref 78.0–100.0)
Monocytes Absolute: 0.3 10*3/uL (ref 0.1–1.0)
Monocytes Relative: 5 %
NEUTROS ABS: 3.2 10*3/uL (ref 1.7–7.7)
Neutrophils Relative %: 49 %
PLATELETS: 348 10*3/uL (ref 150–400)
RBC: 3.64 MIL/uL — AB (ref 3.87–5.11)
RDW: 15.3 % (ref 11.5–15.5)
WBC: 6.5 10*3/uL (ref 4.0–10.5)

## 2016-12-26 MED ORDER — BUTALBITAL-APAP-CAFFEINE 50-325-40 MG PO CAPS: 1.0000 | ORAL_CAPSULE | Freq: Four times a day (QID) | ORAL | 0 refills | Status: DC | PRN

## 2016-12-26 MED ORDER — ALIGN PO CAPS: 1.0000 | ORAL_CAPSULE | Freq: Every day | ORAL | 3 refills | Status: DC

## 2016-12-26 MED ORDER — SIMETHICONE 80 MG PO TABS: 1.0000 | ORAL_TABLET | Freq: Every day | ORAL | 2 refills | Status: DC

## 2016-12-26 MED ORDER — HYOSCYAMINE SULFATE 0.125 MG SL SUBL
0.1250 mg | SUBLINGUAL_TABLET | Freq: Once | SUBLINGUAL | Status: AC
  Administered 2016-12-26: 0.125 mg via SUBLINGUAL
  Filled 2016-12-26: qty 1

## 2016-12-26 MED ORDER — HYOSCYAMINE SULFATE 0.125 MG PO TABS: 0.1250 mg | ORAL_TABLET | Freq: Four times a day (QID) | ORAL | 0 refills | Status: DC | PRN

## 2016-12-26 MED ORDER — SIMETHICONE 80 MG PO CHEW
80.0000 mg | CHEWABLE_TABLET | Freq: Once | ORAL | Status: AC
  Administered 2016-12-26: 80 mg via ORAL
  Filled 2016-12-26: qty 1

## 2016-12-26 NOTE — Discharge Instructions (Signed)
Abdominal Bloating When you have abdominal bloating, your abdomen may feel full, tight, or painful. It may also look bigger than normal or swollen (distended). Common causes of abdominal bloating include:  Swallowing air.  Constipation.  Problems digesting food.  Eating too much.  Irritable bowel syndrome. This is a condition that affects the large intestine.  Lactose intolerance. This is an inability to digest lactose, a natural sugar in dairy products.  Celiac disease. This is a condition that affects the ability to digest gluten, a protein found in some grains.  Gastroparesis. This is a condition that slows down the movement of food in the stomach and small intestine. It is more common in people with diabetes mellitus.  Gastroesophageal reflux disease (GERD). This is a digestive condition that makes stomach acid flow back into the esophagus.  Urinary retention. This means that the body is holding onto urine, and the bladder cannot be emptied all the way.  Follow these instructions at home: Eating and drinking  Avoid eating too much.  Try not to swallow air while talking or eating.  Avoid eating while lying down.  Avoid these foods and drinks: ? Foods that cause gas, such as broccoli, cabbage, cauliflower, and baked beans. ? Carbonated drinks. ? Hard candy. ? Chewing gum. Medicines  Take over-the-counter and prescription medicines only as told by your health care provider.  Take probiotic medicines. These medicines contain live bacteria or yeasts that can help digestion.  Take coated peppermint oil capsules. Activity  Try to exercise regularly. Exercise may help to relieve bloating that is caused by gas and relieve constipation. General instructions  Keep all follow-up visits as told by your health care provider. This is important. Contact a health care provider if:  You have nausea and vomiting.  You have diarrhea.  You have abdominal pain.  You have  unusual weight loss or weight gain.  You have severe pain, and medicines do not help. Get help right away if:  You have severe chest pain.  You have trouble breathing.  You have shortness of breath.  You have trouble urinating.  You have darker urine than normal.  You have blood in your stools or have dark, tarry stools. Summary  Abdominal bloating means that the abdomen is swollen.  Common causes of abdominal bloating are swallowing air, constipation, and problems digesting food.  Avoid eating too much and avoid swallowing air.  Avoid foods that cause gas, carbonated drinks, hard candy, and chewing gum. This information is not intended to replace advice given to you by your health care provider. Make sure you discuss any questions you have with your health care provider. Document Released: 05/25/2016 Document Revised: 05/25/2016 Document Reviewed: 05/25/2016 Elsevier Interactive Patient Education  2018 [employer].  Migraine Headache A migraine headache is an intense, throbbing pain on one side or both sides of the head. Migraines may also cause other symptoms, such as nausea, vomiting, and sensitivity to light and noise. What are the causes? Doing or taking certain things may also trigger migraines, such as:  Alcohol.  Smoking.  Medicines, such as: ? Medicine used to treat chest pain (nitroglycerine). ? Birth control pills. ? Estrogen pills. ? Certain blood pressure medicines.  Aged cheeses, chocolate, or caffeine.  Foods or drinks that contain nitrates, glutamate, aspartame, or tyramine.  Physical activity.  Other things that may trigger a migraine include:  Menstruation.  Pregnancy.  Hunger.  Stress, lack of sleep, too much sleep, or fatigue.  Weather changes.  What  increases the risk? The following factors may make you more likely to experience migraine headaches:  Age. Risk increases with age.  Family history of migraine headaches.  Being  Caucasian.  Depression and anxiety.  Obesity.  Being a woman.  Having a hole in the heart (patent foramen ovale) or other heart problems.  What are the signs or symptoms? The main symptom of this condition is pulsating or throbbing pain. Pain may:  Happen in any area of the head, such as on one side or both sides.  Interfere with daily activities.  Get worse with physical activity.  Get worse with exposure to bright lights or loud noises.  Other symptoms may include:  Nausea.  Vomiting.  Dizziness.  General sensitivity to bright lights, loud noises, or smells.  Before you get a migraine, you may get warning signs that a migraine is developing (aura). An aura may include:  Seeing flashing lights or having blind spots.  Seeing bright spots, halos, or zigzag lines.  Having tunnel vision or blurred vision.  Having numbness or a tingling feeling.  Having trouble talking.  Having muscle weakness.  How is this diagnosed? A migraine headache can be diagnosed based on:  Your symptoms.  A physical exam.  Tests, such as CT scan or MRI of the head. These imaging tests can help rule out other causes of headaches.  Taking fluid from the spine (lumbar puncture) and analyzing it (cerebrospinal fluid analysis, or CSF analysis).  How is this treated? A migraine headache is usually treated with medicines that:  Relieve pain.  Relieve nausea.  Prevent migraines from coming back.  Treatment may also include:  Acupuncture.  Lifestyle changes like avoiding foods that trigger migraines.  Follow these instructions at home: Medicines  Take over-the-counter and prescription medicines only as told by your health care provider.  Do not drive or use heavy machinery while taking prescription pain medicine.  To prevent or treat constipation while you are taking prescription pain medicine, your health care provider may recommend that you: ? Drink enough fluid to keep  your urine clear or pale yellow. ? Take over-the-counter or prescription medicines. ? Eat foods that are high in fiber, such as fresh fruits and vegetables, whole grains, and beans. ? Limit foods that are high in fat and processed sugars, such as fried and sweet foods. Lifestyle  Avoid alcohol use.  Do not use any products that contain nicotine or tobacco, such as cigarettes and e-cigarettes. If you need help quitting, ask your health care provider.  Get at least 8 hours of sleep every night.  Limit your stress. General instructions   Keep a journal to find out what may trigger your migraine headaches. For example, write down: ? What you eat and drink. ? How much sleep you get. ? Any change to your diet or medicines.  If you have a migraine: ? Avoid things that make your symptoms worse, such as bright lights. ? It may help to lie down in a dark, quiet room. ? Do not drive or use heavy machinery. ? Ask your health care provider what activities are safe for you while you are experiencing symptoms.  Keep all follow-up visits as told by your health care provider. This is important. Contact a health care provider if:  You develop symptoms that are different or more severe than your usual migraine symptoms. Get help right away if:  Your migraine becomes severe.  You have a fever.  You have a stiff neck.  You have vision loss.  Your muscles feel weak or like you cannot control them.  You start to lose your balance often.  You develop trouble walking.  You faint. This information is not intended to replace advice given to you by your health care provider. Make sure you discuss any questions you have with your health care provider. Document Released: 04/23/2005 Document Revised: 11/11/2015 Document Reviewed: 10/10/2015 Elsevier Interactive Patient Education  2017 [employer].

## 2016-12-26 NOTE — MAU Note (Signed)
Pt presents with c/o "sharp cramps" in lower abdomen.  States pain 10 on 0-10 scale.  Denies vaginal bleeding.  Pt also reports dysuria, urgency, frequency with urination.

## 2016-12-26 NOTE — MAU Note (Signed)
Pt reports calling Dr. Verdell Carmine office with her complaint and they advised her to be seen in [place].  Pt reports pain in pelvic area 10/10 intermittent.   Pt reports pain with urination.

## 2016-12-26 NOTE — MAU Provider Note (Signed)
Chief Complaint:  Abdominal Pain   First Provider Initiated Contact with Patient 12/26/16 1315     HPI: Sara Mullins is a 31 y.o. G1P0000 at 50w0dwho presents to maternity admissions reporting intermittent sharp pelvic pain in LLQ.  Has some discomfort with urination.  .States has had frequent migraines, but none now. She denies LOF, vaginal bleeding, vaginal itching/burning, urinary symptoms, h/a, dizziness, n/v, diarrhea, constipation or fever/chills.    Abdominal Pain  This is a new problem. The current episode started today. The onset quality is sudden. The problem occurs intermittently. The problem has been waxing and waning. The pain is located in the LLQ. The pain is at a severity of 10/10. The pain is severe. The quality of the pain is cramping, colicky and sharp. The abdominal pain does not radiate. Associated symptoms include dysuria and headaches. Pertinent negatives include no anorexia, constipation, diarrhea, fever, frequency, myalgias, nausea or vomiting. Nothing aggravates the pain. The pain is relieved by nothing. She has tried nothing for the symptoms.   Has a history of abdominal gas/cramping after gastric bypass in past.  This pain does feel similar to that.    RN Note: Pt presents with c/o "sharp cramps" in lower abdomen.  States pain 10 on 0-10 scale.  Denies vaginal bleeding.  Pt also reports dysuria, urgency, frequency with urination.   Past Medical History: Past Medical History:  Diagnosis Date  . Asthma   . Diabetes mellitus without complication (HCC)   . Headache   . PTSD (post-traumatic stress disorder)   . PTSD (post-traumatic stress disorder)     Past obstetric history: OB History  Gravida Para Term Preterm AB Living  1 0 0 0 0 0  SAB TAB Ectopic Multiple Live Births  0 0 0 0 0    # Outcome Date GA Lbr Len/2nd Weight Sex Delivery Anes PTL Lv  1 Current               Past Surgical History: Past Surgical History:  Procedure Laterality Date  .  GASTRIC BYPASS    . LAPAROSCOPIC GASTRIC SLEEVE RESECTION      Family History: Family History  Problem Relation Age of Onset  . Cancer Mother   . Cancer Maternal Grandmother     Social History: Social History  Substance Use Topics  . Smoking status: Former Games developer  . Smokeless tobacco: Never Used  . Alcohol use No    Allergies: No Known Allergies  Meds:  Prescriptions Prior to Admission  Medication Sig Dispense Refill Last Dose  . acetaminophen (TYLENOL) 325 MG tablet Take 650 mg by mouth every 6 (six) hours as needed.   Taking  . albuterol (PROVENTIL HFA;VENTOLIN HFA) 108 (90 Base) MCG/ACT inhaler Inhale 2 puffs into the lungs every 6 (six) hours as needed for wheezing or shortness of breath. 1 Inhaler 2   . Cholecalciferol (VITAMIN D) 2000 units tablet Take 1 tablet (2,000 Units total) by mouth daily. (Patient not taking: Reported on 12/13/2016) 1 tablet 11 Not Taking  . Doxylamine-Pyridoxine (DICLEGIS) 10-10 MG TBEC Take 1 tablet with breakfast and lunch.  Take 2 tablets at bedtime. (Patient not taking: Reported on 12/13/2016) 100 tablet 4 Not Taking  . metoCLOPramide (REGLAN) 10 MG tablet Take 1 tablet (10 mg total) by mouth 3 (three) times daily with meals. (Patient not taking: Reported on 12/13/2016) 90 tablet 1 Not Taking  . ondansetron (ZOFRAN ODT) 4 MG disintegrating tablet Take 1 tablet (4 mg total) by mouth every 8 (eight)  hours as needed for nausea or vomiting. 20 tablet 0 Taking  . Prenat-FeFum-FePo-FA-Omega 3 (CONCEPT DHA) 53.5-38-1 MG CAPS Take 1 tablet by mouth daily. (Patient not taking: Reported on 12/13/2016) 30 capsule 2 Not Taking  . Prenatal-DSS-FeCb-FeGl-FA (CITRANATAL BLOOM) 90-1 MG TABS Take 1 tablet by mouth daily before breakfast. 90 tablet 4 Taking  . promethazine (PHENERGAN) 12.5 MG tablet Take 1 tablet (12.5 mg total) by mouth every 6 (six) hours as needed for nausea or vomiting. (Patient not taking: Reported on 12/13/2016) 30 tablet 0 Not Taking    I have  reviewed patient's Past Medical Hx, Surgical Hx, Family Hx, Social Hx, medications and allergies.   ROS:  Review of Systems  Constitutional: Negative for fever.  Gastrointestinal: Positive for abdominal pain. Negative for anorexia, constipation, diarrhea, nausea and vomiting.  Genitourinary: Positive for dysuria. Negative for frequency.  Musculoskeletal: Negative for myalgias.  Neurological: Positive for headaches.   Other systems negative  Physical Exam  Patient Vitals for the past 24 hrs:  BP Temp Temp src Pulse Resp SpO2 Height Weight  12/26/16 1250 130/81 98.9 F (37.2 C) Oral 93 19 99 % 5\' 3"  (1.6 m) 244 lb (110.7 kg)   Constitutional: Well-developed, well-nourished female in no acute distress.  Cardiovascular: normal rate and rhythm Respiratory: normal effort, clear to auscultation bilaterally GI: Abd soft, non-tender in most of abdomen, slightly tender in LLQ, gravid appropriate for gestational age.   No rebound or guarding. MS: Extremities nontender, no edema, normal ROM Neurologic: Alert and oriented x 4.  GU: Neg CVAT.  PELVIC EXAM: Cervix pink, visually closed, without lesion, scant white creamy discharge, vaginal walls and external genitalia normal Bimanual exam: Cervix firm, posterior, neg CMT, uterus nontender, Fundal Height consistent with dates, adnexa without tenderness, enlargement, or mass  FHT:  148   Labs: Results for orders placed or performed during the hospital encounter of 12/26/16 (from the past 24 hour(s))  Urinalysis, Routine w reflex microscopic     Status: Abnormal   Collection Time: 12/26/16 12:45 PM  Result Value Ref Range   Color, Urine YELLOW YELLOW   APPearance HAZY (A) CLEAR   Specific Gravity, Urine 1.021 1.005 - 1.030   pH 6.0 5.0 - 8.0   Glucose, UA 50 (A) NEGATIVE mg/dL   Hgb urine dipstick NEGATIVE NEGATIVE   Bilirubin Urine NEGATIVE NEGATIVE   Ketones, ur NEGATIVE NEGATIVE mg/dL   Protein, ur NEGATIVE NEGATIVE mg/dL   Nitrite  NEGATIVE NEGATIVE   Leukocytes, UA NEGATIVE NEGATIVE  CBC with Differential/Platelet     Status: Abnormal   Collection Time: 12/26/16  1:54 PM  Result Value Ref Range   WBC 6.5 4.0 - 10.5 K/uL   RBC 3.64 (L) 3.87 - 5.11 MIL/uL   Hemoglobin 10.4 (L) 12.0 - 15.0 g/dL   HCT 16.1 (L) 09.6 - 04.5 %   MCV 81.6 78.0 - 100.0 fL   MCH 28.6 26.0 - 34.0 pg   MCHC 35.0 30.0 - 36.0 g/dL   RDW 40.9 81.1 - 91.4 %   Platelets 348 150 - 400 K/uL   Neutrophils Relative % 49 %   Neutro Abs 3.2 1.7 - 7.7 K/uL   Lymphocytes Relative 45 %   Lymphs Abs 2.9 0.7 - 4.0 K/uL   Monocytes Relative 5 %   Monocytes Absolute 0.3 0.1 - 1.0 K/uL   Eosinophils Relative 1 %   Eosinophils Absolute 0.1 0.0 - 0.7 K/uL   Basophils Relative 0 %   Basophils Absolute 0.0 0.0 -  0.1 K/uL    Imaging:  No results found.  MAU Course/MDM: I have ordered labs and reviewed results. WBC normal.  UA negative  Treatments in MAU included Levsin and Mylicon with some relief.  .    Evaluation does not show pathology that would require ongoing emergent intervention or inpatient treatment. Pt is hemodynamically stable and mentating appropriately. Discussed findings and plan with patient, who agrees with care plan. All questions answered. Return precautions discussed and outpatient follow up given.   It has been determined that no acute conditions requiring further emergency intervention are present at this time. We have discussed signs and symptoms that warrant return to the ED, such as changes or worsening in symptoms, or fever.   Exam is not consistent with an acute abdominal process.  I did above labs to assess for presence of acute infections like appendicitis, and these were normal.  There is no fever. Tenderness was minimal by my exam.  It was also nonfocal.    Assessment: [facility] at [redacted]w[redacted]d Abdominal pain affecting pregnancy - Plan: Discharge patient  Colic cramps - Plan: Discharge patient  Other migraine without status  migrainosus, not intractable - has them more often recently - Plan: Discharge patient   Plan: Discharge home Will give Mylicon and Levsin for cramps and Fioricet for migraines. RTO as scheduled Encouraged to return here or to other Urgent Care/ED if she develops worsening of symptoms, increase in pain, fever, or other concerning symptoms.     Allergies as of 12/26/2016   No Known Allergies     Medication List    TAKE these medications   acetaminophen 325 MG tablet Commonly known as:  TYLENOL Take 650 mg by mouth every 6 (six) hours as needed for mild pain.   albuterol 108 (90 Base) MCG/ACT inhaler Commonly known as:  PROVENTIL HFA;VENTOLIN HFA Inhale 2 puffs into the lungs every 6 (six) hours as needed for wheezing or shortness of breath.   bifidobacterium infantis capsule Take 1 capsule by mouth daily.   Butalbital-APAP-Caffeine 50-325-40 MG capsule Take 1-2 capsules by mouth every 6 (six) hours as needed for headache.   CITRANATAL BLOOM 90-1 MG Tabs Take 1 tablet by mouth daily before breakfast.   CONCEPT DHA 53.5-38-1 MG Caps Take 1 tablet by mouth daily.   Doxylamine-Pyridoxine 10-10 MG Tbec Commonly known as:  DICLEGIS Take 1 tablet with breakfast and lunch.  Take 2 tablets at bedtime.   hyoscyamine 0.125 MG tablet Commonly known as:  LEVSIN, ANASPAZ Take 1 tablet (0.125 mg total) by mouth every 6 (six) hours as needed for cramping.   metoCLOPramide 10 MG tablet Commonly known as:  REGLAN Take 1 tablet (10 mg total) by mouth 3 (three) times daily with meals.   ondansetron 4 MG disintegrating tablet Commonly known as:  ZOFRAN ODT Take 1 tablet (4 mg total) by mouth every 8 (eight) hours as needed for nausea or vomiting.   promethazine 12.5 MG tablet Commonly known as:  PHENERGAN Take 1 tablet (12.5 mg total) by mouth every 6 (six) hours as needed for nausea or vomiting.   Simethicone 80 MG Tabs Take 1 tablet (80 mg total) by mouth daily.   Vitamin D  2000 units tablet Take 1 tablet (2,000 Units total) by mouth daily.            Discharge Care Instructions        Start     Ordered   12/26/16 0000  hyoscyamine (LEVSIN, ANASPAZ) 0.125 MG tablet  Every 6 hours PRN    Question:  Supervising Provider  Answer:  CONSTANT, PEGGY   12/26/16 1439   12/26/16 0000  bifidobacterium infantis (ALIGN) capsule  Daily    Question:  Supervising Provider  Answer:  CONSTANT, PEGGY   12/26/16 1439   12/26/16 0000  Simethicone 80 MG TABS  Daily    Question:  Supervising Provider  Answer:  CONSTANT, PEGGY   12/26/16 1439   12/26/16 0000  Butalbital-APAP-Caffeine 50-325-40 MG capsule  Every 6 hours PRN    Question:  Supervising Provider  Answer:  CONSTANT, PEGGY   12/26/16 1439   12/26/16 0000  Discharge patient    Question Answer Comment  Discharge disposition 01-Home or Self Care   Discharge patient date 12/26/2016      12/26/16 1439      Follow up in Office for prenatal visits and recheck   Pt stable at time of discharge.  Wynelle Bourgeois CNM, MSN Certified Nurse-Midwife 12/26/2016 1:15 PM

## 2017-01-02 ENCOUNTER — Ambulatory Visit (HOSPITAL_COMMUNITY)
Admission: RE | Admit: 2017-01-02 | Discharge: 2017-01-02 | Disposition: A | Discharge status: Home / Self Care | Payer: Medicaid Other | Source: Ambulatory Visit | Attending: Obstetrics | Admitting: Obstetrics

## 2017-01-02 ENCOUNTER — Other Ambulatory Visit: Payer: Self-pay | Admitting: Obstetrics

## 2017-01-02 DIAGNOSIS — O99212 Obesity complicating pregnancy, second trimester: Secondary | ICD-10-CM

## 2017-01-02 DIAGNOSIS — Z363 Encounter for antenatal screening for malformations: Secondary | ICD-10-CM | POA: Diagnosis present

## 2017-01-02 DIAGNOSIS — Z3A19 19 weeks gestation of pregnancy: Secondary | ICD-10-CM

## 2017-01-02 DIAGNOSIS — Z3400 Encounter for supervision of normal first pregnancy, unspecified trimester: Secondary | ICD-10-CM

## 2017-01-03 ENCOUNTER — Telehealth: Payer: Self-pay

## 2017-01-03 NOTE — Telephone Encounter (Signed)
Patient called in and stated that she has been the same pain in her legs, pelvic area and limbs that she was seen for in MAU on 12-26-16. Pt rates pain 10/10, denies bleeding but states she is unsure of fetal movement because of the pain. Pt reports that pain is so bad that she has been unable to go to work, and cannot even climb into her bath tub. [facility].

## 2017-01-07 ENCOUNTER — Encounter (HOSPITAL_COMMUNITY): Payer: Self-pay | Admitting: Emergency Medicine

## 2017-01-07 ENCOUNTER — Emergency Department (HOSPITAL_COMMUNITY)
Admission: EM | Admit: 2017-01-07 | Discharge: 2017-01-07 | Disposition: A | Discharge status: Home / Self Care | Payer: Medicaid Other | Attending: Emergency Medicine | Admitting: Emergency Medicine

## 2017-01-07 DIAGNOSIS — R1030 Lower abdominal pain, unspecified: Secondary | ICD-10-CM | POA: Diagnosis not present

## 2017-01-07 DIAGNOSIS — Z79899 Other long term (current) drug therapy: Secondary | ICD-10-CM | POA: Insufficient documentation

## 2017-01-07 DIAGNOSIS — J45909 Unspecified asthma, uncomplicated: Secondary | ICD-10-CM | POA: Insufficient documentation

## 2017-01-07 DIAGNOSIS — Z87891 Personal history of nicotine dependence: Secondary | ICD-10-CM | POA: Insufficient documentation

## 2017-01-07 DIAGNOSIS — R1032 Left lower quadrant pain: Secondary | ICD-10-CM

## 2017-01-07 NOTE — ED Notes (Signed)
Bed: WA19 Expected date:  Expected time:  Means of arrival:  Comments: EMS-fall 

## 2017-01-07 NOTE — ED Provider Notes (Signed)
[facility]-EMERGENCY DEPT Provider Note   CSN: [id number] Arrival date & time: 01/07/17  1259     History   Chief Complaint Chief Complaint  Patient presents with  . Groin Pain    HPI Sara Mullins is a 31 y.o. female.  31 year old female presents with several weeks of left groin pain. Denies any trauma and states that the pain is dull and worse with standing. She is currently [redacted] weeks pregnant and has not had any uterine cramping or vaginal bleeding. Denies any urinary symptoms. Was seen by her physician for similar symptoms recently and was felt to have ligament pain at that time. Pain does not radiate down her leg. She denies any foot drop on the left. No severe back discomfort. Pain is also worse with sitting and she is concerned that she has been back to work tomorrow.      Past Medical History:  Diagnosis Date  . Asthma   . Diabetes mellitus without complication (HCC)   . Headache   . PTSD (post-traumatic stress disorder)   . PTSD (post-traumatic stress disorder)     Patient Active Problem List   Diagnosis Date Noted  . Supervision of normal first pregnancy, antepartum 11/16/2016    Past Surgical History:  Procedure Laterality Date  . GASTRIC BYPASS    . LAPAROSCOPIC GASTRIC SLEEVE RESECTION      OB History    Gravida Para Term Preterm AB Living   1 0 0 0 0 0   SAB TAB Ectopic Multiple Live Births   0 0 0 0 0       Home Medications    Prior to Admission medications   Medication Sig Start Date End Date Taking? Authorizing Provider  acetaminophen (TYLENOL) 325 MG tablet Take 650 mg by mouth every 6 (six) hours as needed for mild pain.     [provider]  albuterol (PROVENTIL HFA;VENTOLIN HFA) 108 (90 Base) MCG/ACT inhaler Inhale 2 puffs into the lungs every 6 (six) hours as needed for wheezing or shortness of breath. 12/14/16   Brock Bad, MD  bifidobacterium infantis (ALIGN) capsule Take 1 capsule by mouth daily. 12/26/16   Aviva Signs,  CNM  Butalbital-APAP-Caffeine [phone] MG capsule Take 1-2 capsules by mouth every 6 (six) hours as needed for headache. 12/26/16   Aviva Signs, CNM  Cholecalciferol (VITAMIN D) 2000 units tablet Take 1 tablet (2,000 Units total) by mouth daily. Patient not taking: Reported on 12/13/2016 11/22/16   Brock Bad, MD  Doxylamine-Pyridoxine (DICLEGIS) 10-10 MG TBEC Take 1 tablet with breakfast and lunch.  Take 2 tablets at bedtime. Patient not taking: Reported on 12/13/2016 12/11/16   Orvilla Cornwall A, CNM  hyoscyamine (LEVSIN, ANASPAZ) 0.125 MG tablet Take 1 tablet (0.125 mg total) by mouth every 6 (six) hours as needed for cramping. 12/26/16   Aviva Signs, CNM  metoCLOPramide (REGLAN) 10 MG tablet Take 1 tablet (10 mg total) by mouth 3 (three) times daily with meals. Patient not taking: Reported on 12/13/2016 10/24/16   Marlis Edelson, CNM  ondansetron (ZOFRAN ODT) 4 MG disintegrating tablet Take 1 tablet (4 mg total) by mouth every 8 (eight) hours as needed for nausea or vomiting. 12/08/16   Montez Morita, CNM  Prenat-FeFum-FePo-FA-Omega 3 (CONCEPT DHA) 53.5-38-1 MG CAPS Take 1 tablet by mouth daily. Patient not taking: Reported on 12/13/2016 10/24/16   Marlis Edelson, CNM  Prenatal-DSS-FeCb-FeGl-FA [facility] BLOOM) 90-1 MG TABS Take 1 tablet by mouth daily before breakfast.  11/16/16   Brock BadHarper, Charles A, MD  promethazine (PHENERGAN) 12.5 MG tablet Take 1 tablet (12.5 mg total) by mouth every 6 (six) hours as needed for nausea or vomiting. 12/11/16   Orvilla Cornwallenney, Rachelle A, CNM  Simethicone 80 MG TABS Take 1 tablet (80 mg total) by mouth daily. 12/26/16   Aviva SignsWilliams, Marie L, CNM    Family History Family History  Problem Relation Age of Onset  . Cancer Mother   . Cancer Maternal Grandmother     Social History Social History  Substance Use Topics  . Smoking status: Former Games developermoker  . Smokeless tobacco: Never Used  . Alcohol use No     Allergies   Patient has no known  allergies.   Review of Systems Review of Systems  All other systems reviewed and are negative.    Physical Exam Updated Vital Signs BP 133/83 (BP Location: Left Arm)   Pulse (!) 102   Temp 97.8 F (36.6 C) (Oral)   Resp 18   LMP 08/12/2016   SpO2 99%   Physical Exam  Constitutional: She is oriented to person, place, and time. She appears well-developed and well-nourished.  Non-toxic appearance. No distress.  HENT:  Head: Normocephalic and atraumatic.  Eyes: Pupils are equal, round, and reactive to light. Conjunctivae, EOM and lids are normal.  Neck: Normal range of motion. Neck supple. No tracheal deviation present. No thyroid mass present.  Cardiovascular: Normal rate, regular rhythm and normal heart sounds.  Exam reveals no gallop.   No murmur heard. Pulmonary/Chest: Effort normal and breath sounds normal. No stridor. No respiratory distress. She has no decreased breath sounds. She has no wheezes. She has no rhonchi. She has no rales.  Abdominal: Soft. Normal appearance and bowel sounds are normal. She exhibits no distension. There is no tenderness. There is no rebound and no CVA tenderness.  Musculoskeletal: Normal range of motion. She exhibits no edema or tenderness.       Legs: No hernias appreciated. Skin normal.  Neurological: She is alert and oriented to person, place, and time. She has normal strength. No cranial nerve deficit or sensory deficit. GCS eye subscore is 4. GCS verbal subscore is 5. GCS motor subscore is 6.  Skin: Skin is warm and dry. No abrasion and no rash noted.  Psychiatric: She has a normal mood and affect. Her speech is normal and behavior is normal.  Nursing note and vitals reviewed.    ED Treatments / Results  Labs (all labs ordered are listed, but only abnormal results are displayed) Labs Reviewed - No data to display  EKG  EKG Interpretation None       Radiology No results found.  Procedures Procedures (including critical care  time)  Medications Ordered in ED Medications - No data to display   Initial Impression / Assessment and Plan / ED Course  I have reviewed the triage vital signs and the nursing notes.  Pertinent labs & imaging results that were available during my care of the patient were reviewed by me and considered in my medical decision making (see chart for details).     Patient has no complaints related to vaginal bleeding or decreased fetal movement. Patient appears to have ligament pain and will be given a work note and has scheduled follow-up with her doctor in 2 days. Return precautions given  Final Clinical Impressions(s) / ED Diagnoses   Final diagnoses:  None    New Prescriptions New Prescriptions   No medications on file  Lorre Nick, MD 01/07/17 [phone]

## 2017-01-07 NOTE — ED Triage Notes (Signed)
Pt c/o bilateral groin pain radiating to the back x several weeks. Pt states she has been taking Tylenol with no relief or decrease in pain. Pt [facility] and dx with ligament pain and gas, but pt states she has had to miss work due to severity of pain. Denies bleeding, denies discharge.

## 2017-01-07 NOTE — Discharge Instructions (Signed)
Keep your appointment with your OB doctor in 2 days. [facility] you develop vaginal bleeding, decreased baby movement, or any other problems. Use warm heat and Tylenol as needed

## 2017-01-09 ENCOUNTER — Encounter: Payer: Self-pay | Admitting: *Deleted

## 2017-01-09 ENCOUNTER — Ambulatory Visit (INDEPENDENT_AMBULATORY_CARE_PROVIDER_SITE_OTHER): Payer: Medicaid Other | Admitting: Certified Nurse Midwife

## 2017-01-09 ENCOUNTER — Encounter: Payer: Self-pay | Admitting: Certified Nurse Midwife

## 2017-01-09 VITALS — BP 107/69 | HR 94 | Wt 242.0 lb

## 2017-01-09 DIAGNOSIS — F419 Anxiety disorder, unspecified: Secondary | ICD-10-CM | POA: Diagnosis not present

## 2017-01-09 DIAGNOSIS — Z8659 Personal history of other mental and behavioral disorders: Secondary | ICD-10-CM | POA: Diagnosis not present

## 2017-01-09 DIAGNOSIS — Z3400 Encounter for supervision of normal first pregnancy, unspecified trimester: Secondary | ICD-10-CM

## 2017-01-09 DIAGNOSIS — F329 Major depressive disorder, single episode, unspecified: Secondary | ICD-10-CM

## 2017-01-09 DIAGNOSIS — O99210 Obesity complicating pregnancy, unspecified trimester: Secondary | ICD-10-CM | POA: Diagnosis not present

## 2017-01-09 DIAGNOSIS — R109 Unspecified abdominal pain: Secondary | ICD-10-CM

## 2017-01-09 DIAGNOSIS — O26892 Other specified pregnancy related conditions, second trimester: Secondary | ICD-10-CM

## 2017-01-09 DIAGNOSIS — Z9884 Bariatric surgery status: Secondary | ICD-10-CM

## 2017-01-09 DIAGNOSIS — F32A Depression, unspecified: Secondary | ICD-10-CM

## 2017-01-09 DIAGNOSIS — O99342 Other mental disorders complicating pregnancy, second trimester: Secondary | ICD-10-CM

## 2017-01-09 MED ORDER — CYCLOBENZAPRINE HCL 10 MG PO TABS: 10.0000 mg | ORAL_TABLET | Freq: Three times a day (TID) | ORAL | 1 refills | Status: AC | PRN

## 2017-01-09 MED ORDER — SERTRALINE HCL 100 MG PO TABS: 100.0000 mg | ORAL_TABLET | Freq: Every day | ORAL | 12 refills | Status: AC

## 2017-01-09 NOTE — Progress Notes (Signed)
PRENATAL VISIT NOTE  Subjective:  Sara Mullins is a 31 y.o. G1P0000 at 74w0dbeing seen today for ongoing prenatal care.  She is currently monitored for the following issues for this high-risk pregnancy and has Supervision of normal first pregnancy, antepartum and Obesity affecting pregnancy, antepartum on her problem list.  Patient reports backache, fatigue, no bleeding, no contractions, no cramping, no leaking and round ligament pain, depression and anxiety; denies homicidal/suicidal thoughts/plan.  Is crying during exam.  Met with SW.  Letter composed to be out of work d/t patient expressing that she is constant pain unless she is lying down.  Contractions: Not present. Vag. Bleeding: None.   . Denies leaking of fluid.   The following portions of the patient's history were reviewed and updated as appropriate: allergies, current medications, past family history, past medical history, past social history, past surgical history and problem list. Problem list updated.  Objective:   Vitals:   01/09/17 0934  BP: 107/69  Pulse: 94  Weight: 242 lb (109.8 kg)    Fetal Status: Fetal Heart Rate (bpm): 145; doppler Fundal Height: 24 cm       General:  Alert, oriented and cooperative. Patient is in no acute distress.  Skin: Skin is warm and dry. No rash noted.   Cardiovascular: Normal heart rate noted  Respiratory: Normal respiratory effort, no problems with respiration noted  Abdomen: Soft, gravid, appropriate for gestational age.  Pain/Pressure: Present     Pelvic: Cervical exam deferred        Extremities: Normal range of motion.     Mental Status:  Normal mood and affect. Normal behavior. Normal judgment and thought content.   Assessment and Plan:  Pregnancy: G1P0000 at 254w0d1. Supervision of normal first pregnancy, antepartum      Chronic pain issues, depression in pregnancy: new dx and treatment plan. Round lig/back pain in pregnancy has maternity support belt.  Letter composed  to be out of work d/t psychiatric and morbid obesity skeletal complications.    - [place]; Future - AFP, Serum, Open Spina Bifida - MaterniT21 PLUS Core+SCA - Hemoglobin A1c - Inheritest Society Guided  2. Depression affecting pregnancy in second trimester, antepartum      Starting Zoloft, [facility]  - [place] 100 MG tablet; Take 1 tablet (100 mg total) by mouth daily.  Dispense: 30 tablet; Refill: 12  3. History of posttraumatic stress disorder (PTSD)      Started as a teenager.  - sertraline (ZOLOFT) 100 MG tablet; Take 1 tablet (100 mg total) by mouth daily.  Dispense: 30 tablet; Refill: 12  4. Anxiety during pregnancy in second trimester, antepartum    - sertraline (ZOLOFT) 100 MG tablet; Take 1 tablet (100 mg total) by mouth daily.  Dispense: 30 tablet; Refill: 12  5. Abdominal pain during pregnancy in second trimester     - cyclobenzaprine (FLEXERIL) 10 MG tablet; Take 1 tablet (10 mg total) by mouth every 8 (eight) hours as needed for muscle spasms.  Dispense: 30 tablet; Refill: 1  6. Obesity affecting pregnancy, antepartum      7.  History of gastric bypass.  Preterm labor symptoms and general obstetric precautions including but not limited to vaginal bleeding, contractions, leaking of fluid and fetal movement were reviewed in detail with the patient. Please refer to After Visit Summary for other counseling recommendations.  Return in  about 4 weeks (around 02/06/2017) for ROB.   Morene Crocker, CNM

## 2017-01-09 NOTE — Patient Instructions (Addendum)
Second Trimester of Pregnancy The second trimester is from week 13 through week 28, month 4 through 6. This is often the time in pregnancy that you feel your best. Often times, morning sickness has lessened or quit. You may have more energy, and you may get hungry more often. Your unborn baby (fetus) is growing rapidly. At the end of the sixth month, he or she is about 9 inches long and weighs about 1 pounds. You will likely feel the baby move (quickening) between 18 and 20 weeks of pregnancy. Follow these instructions at home:  Avoid all smoking, herbs, and alcohol. Avoid drugs not approved by your doctor.  Do not use any tobacco products, including cigarettes, chewing tobacco, and electronic cigarettes. If you need help quitting, ask your doctor. You may get counseling or other support to help you quit.  Only take medicine as told by your doctor. Some medicines are safe and some are not during pregnancy.  Exercise only as told by your doctor. Stop exercising if you start having cramps.  Eat regular, healthy meals.  Wear a good support bra if your breasts are tender.  Do not use hot tubs, steam rooms, or saunas.  Wear your seat belt when driving.  Avoid raw meat, uncooked cheese, and liter boxes and soil used by cats.  Take your prenatal vitamins.  Take 1500-2000 milligrams of calcium daily starting at the 20th week of pregnancy until you deliver your baby.  Try taking medicine that helps you poop (stool softener) as needed, and if your doctor approves. Eat more fiber by eating fresh fruit, vegetables, and whole grains. Drink enough fluids to keep your pee (urine) clear or pale yellow.  Take warm water baths (sitz baths) to soothe pain or discomfort caused by hemorrhoids. Use hemorrhoid cream if your doctor approves.  If you have puffy, bulging veins (varicose veins), wear support hose. Raise (elevate) your feet for 15 minutes, 3-4 times a day. Limit salt in your diet.  Avoid heavy  lifting, wear low heals, and sit up straight.  Rest with your legs raised if you have leg cramps or low back pain.  Visit your dentist if you have not gone during your pregnancy. Use a soft toothbrush to brush your teeth. Be gentle when you floss.  You can have sex (intercourse) unless your doctor tells you not to.  Go to your doctor visits. Get help if:  You feel dizzy.  You have mild cramps or pressure in your lower belly (abdomen).  You have a nagging pain in your belly area.  You continue to feel sick to your stomach (nauseous), throw up (vomit), or have watery poop (diarrhea).  You have bad smelling fluid coming from your vagina.  You have pain with peeing (urination). Get help right away if:  You have a fever.  You are leaking fluid from your vagina.  You have spotting or bleeding from your vagina.  You have severe belly cramping or pain.  You lose or gain weight rapidly.  You have trouble catching your breath and have chest pain.  You notice sudden or extreme puffiness (swelling) of your face, hands, ankles, feet, or legs.  You have not felt the baby move in over an hour.  You have severe headaches that do not go away with medicine.  You have vision changes. This information is not intended to replace advice given to you by your health care provider. Make sure you discuss any questions you have with your health care   provider. Document Released: 07/18/2009 Document Revised: 09/29/2015 Document Reviewed: 06/24/2012 Elsevier Interactive Patient Education  2017 [employer]  Preterm Labor and Birth Information Pregnancy normally lasts 39-41 weeks. Preterm labor is when labor starts early. It starts before you have been pregnant for 37 whole weeks. What are the risk factors for preterm labor? Preterm labor is more likely to occur in women who:  Have an infection while pregnant.  Have a cervix that is short.  Have gone into preterm labor before.  Have had  surgery on their cervix.  Are younger than age 30.  Are older than age 30.  Are African American.  Are pregnant with two or more babies.  Take street drugs while pregnant.  Smoke while pregnant.  Do not gain enough weight while pregnant.  Got pregnant right after another pregnancy.  What are the symptoms of preterm labor? Symptoms of preterm labor include:  Cramps. The cramps may feel like the cramps some women get during their period. The cramps may happen with watery poop (diarrhea).  Pain in the belly (abdomen).  Pain in the lower back.  Regular contractions or tightening. It may feel like your belly is getting tighter.  Pressure in the lower belly that seems to get stronger.  More fluid (discharge) leaking from the vagina. The fluid may be watery or bloody.  Water breaking.  Why is it important to notice signs of preterm labor? Babies who are born early may not be fully developed. They have a higher chance for:  Long-term heart problems.  Long-term lung problems.  Trouble controlling body systems, like breathing.  Bleeding in the brain.  A condition called cerebral palsy.  Learning difficulties.  Death.  These risks are highest for babies who are born before 34 weeks of pregnancy. How is preterm labor treated? Treatment depends on:  How long you were pregnant.  Your condition.  The health of your baby.  Treatment may involve:  Having a stitch (suture) placed in your cervix. When you give birth, your cervix opens so the baby can come out. The stitch keeps the cervix from opening too soon.  Staying at the hospital.  Taking or getting medicines, such as: ? Hormone medicines. ? Medicines to stop contractions. ? Medicines to help the baby's lungs develop. ? Medicines to prevent your baby from having cerebral palsy.  What should I do if I am in preterm labor? If you think you are going into labor too soon, call your doctor right away. How can  I prevent preterm labor?  Do not use any tobacco products. ? Examples of these are cigarettes, chewing tobacco, and e-cigarettes. ? If you need help quitting, ask your doctor.  Do not use street drugs.  Do not use any medicines unless you ask your doctor if they are safe for you.  Talk with your doctor before taking any herbal supplements.  Make sure you gain enough weight.  Watch for infection. If you think you might have an infection, get it checked right away.  If you have gone into preterm labor before, tell your doctor. This information is not intended to replace advice given to you by your health care provider. Make sure you discuss any questions you have with your health care provider. Document Released: 07/20/2008 Document Revised: 10/04/2015 Document Reviewed: 09/14/2015 Elsevier Interactive Patient Education  2018 [employer]  Before Baby Comes Home Before your baby arrives it is important to:  Have all of the supplies that you will need to  care for your baby.  Know where to go if there is an emergency.  Discuss the baby's arrival with other family members.  What supplies will I need?  It is recommended that you have the following supplies: Large Items  Crib.  Crib mattress.  Rear-facing infant car seat. If possible, have a trained professional check to make sure that it is installed correctly.  Feeding  6-8 bottles that are 4-5 oz in size.  6-8 nipples.  Bottle brush.  Sterilizer, or a large pan or kettle with a lid.  A way to boil and cool water.  If you will be breastfeeding: ? Breast pump. ? Nipple cream. ? Nursing bra. ? Breast pads. ? Breast shields.  If you will be formula feeding: ? Formula. ? Measuring cups. ? Measuring spoons.  Bathing  Mild baby soap and baby shampoo.  Petroleum jelly.  Soft cloth towel and washcloth.  Hooded towel.  Cotton balls.  Bath basin.  Other Supplies  Rectal thermometer.  Bulb  syringe.  Baby wipes or washcloths for diaper changes.  Diaper bag.  Changing pad.  Clothing, including one-piece outfits and pajamas.  Baby nail clippers.  Receiving blankets.  Mattress pad and sheets for the crib.  Night-light for the baby's room.  Baby monitor.  2 or 3 pacifiers.  Either 24-36 cloth diapers and waterproof diaper covers or a box of disposable diapers. You may need to use as many as 10-12 diapers per day.  How do I prepare for an emergency? Prepare for an emergency by:  Knowing how to get to the nearest hospital.  Listing the phone numbers of your baby's health care providers near your home phone and in your cell phone.  How do I prepare my family?  Decide how to handle visitors.  If you have other children: ? Talk with them about the baby coming home. Ask them how they feel about it. ? Read a book together about being a new big brother or sister. ? Find ways to let them help you prepare for the new baby. ? Have someone ready to care for them while you are in the hospital. This information is not intended to replace advice given to you by your health care provider. Make sure you discuss any questions you have with your health care provider. Document Released: 04/05/2008 Document Revised: 09/29/2015 Document Reviewed: 03/31/2014 Elsevier Interactive Patient Education  2018 [employer].   AREA PEDIATRIC/FAMILY PRACTICE PHYSICIANS  [facility] [address] [place], [place]  [address] Phone - [phone]   Fax - [phone]  [facility] [address] [place], [place] [address] Phone - [phone]   Fax - [phone]  [facility] AM[facility] [address] [place], [place]  [address] Phone - [phone]   Fax - [phone]  [facility] [address] [place], [place]  [address] Phone - [phone]   Fax - [phone]  [facility] [address] [place], [place]  [address] Phone - [phone]   Fax - [phone]  [facility] [address] [place], [place]  [address] Phone - [phone]   Fax - [phone]  [facility] [place] [address] [place], [place]  [address] Phone - [phone]   Fax - [phone]  [facility] [address] [place], [address] [place], [place]  [address] Phone - [phone]   Fax - [phone]  [facility] [address] [place], [place]  [address] Phone - [phone]   Fax - [phone] [facility] [place] [address] [place], [place]  [address] Phone - [phone]   Fax - [phone]  [facility] [address] [place], [place]  [address] Phone - [phone]   Fax - [phone]  [facility] [address] [place], [place]  [address] Phone - [phone]   Fax - [phone]  [facility] [place] [address] [place], [place]  [address] Phone - [phone]   Fax - [phone]  [facility] [address], [address] [place], [place]  [address] Phone - [phone]  [facility] [address] [place], [place]  [address] Phone - [phone]   Fax - [phone]  [facility] [address] [place], [place]  [address] Phone - [phone]   Fax - [phone]  [facility] [address] [place], [place]  [address] Phone - [phone]   Fax - [phone]  [facility] [address] [place], [place]  [address] Phone - [phone]   Fax - [phone]   [facility] [address] [place], [place]  [address] Phone - [phone]   Fax - [phone]  [facility] [address] [place], [place]  [address] Phone - [phone]   Fax -  [phone]  DAVID RUBIN [address] [place], [place]  [address] Phone - [phone]   Fax - [phone]  [facility] [address] [place], [place]  [address] Phone - [phone]   Fax - [phone]  [facility] Al[facility] Chyle [address] [place], [place]  [address] Phone - [phone]   Fax - [phone] Gerarda Fraction [address] [place], [place]  [address] Phone - [phone]   Fax - [phone]  [facility] [address] [place], [place]  [address] Phone - [phone]   Fax - [phone]  [facility] [place] [address] [place], [place]  [address] Phone - [phone]   Fax - [phone]  [facility] [place] Wyvonne Lenz MD [address] [place] [address] Phone [phone]  Fax [phone]

## 2017-01-09 NOTE — Progress Notes (Signed)
Discuss work conditions. Pt states she is having severe ligament pain and was sent home last week from work. Pt states that she has intermittent leave papers in office to be completed.  Pt states that she sits all day at work and makes it harder for her to move around. Pt would like to know if she should return to work or remain out.  [facility], pt states she doesn't like their process/ use of computer communication. Pt states that did give her meds in the past. Pt state she is currently only taking PNV and nausea meds. Depression score today is 13. Provider aware and will discuss.

## 2017-01-10 DIAGNOSIS — Z8659 Personal history of other mental and behavioral disorders: Secondary | ICD-10-CM | POA: Insufficient documentation

## 2017-01-10 DIAGNOSIS — O99342 Other mental disorders complicating pregnancy, second trimester: Secondary | ICD-10-CM

## 2017-01-10 DIAGNOSIS — F419 Anxiety disorder, unspecified: Secondary | ICD-10-CM | POA: Insufficient documentation

## 2017-01-10 DIAGNOSIS — O26892 Other specified pregnancy related conditions, second trimester: Secondary | ICD-10-CM | POA: Insufficient documentation

## 2017-01-10 DIAGNOSIS — R109 Unspecified abdominal pain: Secondary | ICD-10-CM

## 2017-01-10 DIAGNOSIS — F329 Major depressive disorder, single episode, unspecified: Secondary | ICD-10-CM | POA: Insufficient documentation

## 2017-01-10 LAB — HEMOGLOBIN A1C
ESTIMATED AVERAGE GLUCOSE: 111 mg/dL
Hgb A1c MFr Bld: 5.5 % (ref 4.8–5.6)

## 2017-01-12 LAB — AFP, SERUM, OPEN SPINA BIFIDA
AFP MoM: 1.23
AFP Value: 56.9 ng/mL
GEST. AGE ON COLLECTION DATE: 20 wk
Maternal Age At EDD: 30.6 yr
OSBR Risk 1 IN: 10000
TEST RESULTS AFP: NEGATIVE
WEIGHT: 242 [lb_av]

## 2017-01-14 LAB — MATERNIT21 PLUS CORE+SCA
Chromosome 13: NEGATIVE
Chromosome 18: NEGATIVE
Chromosome 21: NEGATIVE
Y Chromosome: DETECTED

## 2017-01-19 ENCOUNTER — Other Ambulatory Visit: Payer: Self-pay | Admitting: Certified Nurse Midwife

## 2017-01-19 DIAGNOSIS — Z3400 Encounter for supervision of normal first pregnancy, unspecified trimester: Secondary | ICD-10-CM

## 2017-01-21 LAB — INHERITEST SOCIETY GUIDED

## 2017-01-22 ENCOUNTER — Other Ambulatory Visit: Payer: Self-pay | Admitting: Certified Nurse Midwife

## 2017-01-22 DIAGNOSIS — Z3400 Encounter for supervision of normal first pregnancy, unspecified trimester: Secondary | ICD-10-CM

## 2017-02-06 ENCOUNTER — Encounter: Payer: Medicaid Other | Admitting: Certified Nurse Midwife

## 2017-02-06 ENCOUNTER — Encounter: Payer: Self-pay | Admitting: *Deleted

## 2017-02-08 ENCOUNTER — Encounter: Payer: Medicaid Other | Admitting: Certified Nurse Midwife

## 2017-02-08 ENCOUNTER — Ambulatory Visit (HOSPITAL_COMMUNITY)
Admission: RE | Admit: 2017-02-08 | Discharge: 2017-02-08 | Disposition: A | Discharge status: Home / Self Care | Payer: Medicaid Other | Source: Ambulatory Visit | Attending: Certified Nurse Midwife | Admitting: Certified Nurse Midwife

## 2017-02-08 DIAGNOSIS — Z3A24 24 weeks gestation of pregnancy: Secondary | ICD-10-CM | POA: Insufficient documentation

## 2017-02-08 DIAGNOSIS — Z362 Encounter for other antenatal screening follow-up: Secondary | ICD-10-CM | POA: Insufficient documentation

## 2017-02-08 DIAGNOSIS — Z3400 Encounter for supervision of normal first pregnancy, unspecified trimester: Secondary | ICD-10-CM

## 2017-02-11 ENCOUNTER — Telehealth: Payer: Self-pay | Admitting: *Deleted

## 2017-02-11 ENCOUNTER — Other Ambulatory Visit: Payer: Self-pay | Admitting: Certified Nurse Midwife

## 2017-02-11 DIAGNOSIS — Z3400 Encounter for supervision of normal first pregnancy, unspecified trimester: Secondary | ICD-10-CM

## 2017-02-11 NOTE — Telephone Encounter (Signed)
PATIENT HAS TRANSFERRED TO A NEW CITY...

## 2017-02-15 ENCOUNTER — Telehealth: Payer: Self-pay

## 2017-02-15 NOTE — Telephone Encounter (Signed)
Pt called requesting records. Pt has moved to [place]. Pt has been informed that she needs to sign a records release form. Pt states that she does not have transportation to get up here, and hung up the phone.

## 2017-02-15 NOTE — Telephone Encounter (Signed)
Pt called and left message yesterday at 2:54. Called pt back and left message for her to return call to office

## 2017-08-21 ENCOUNTER — Encounter (HOSPITAL_COMMUNITY): Payer: Self-pay

## 2018-03-09 IMAGING — US US OB COMP LESS 14 WK
1 series · 15 of 28 positions shown · non-contrast
Comparison: None.

CLINICAL DATA: 31-year-old pregnant female presents for assessment
of fetal dating and viability. Quantitative beta HCG 114 on
09/19/2016.

EDC by LMP: 05/19/2017, projecting to an expected gestational age of
13 weeks 3 days.
EXAM:
OBSTETRIC <14 WK ULTRASOUND
TECHNIQUE: Transabdominal ultrasound was performed for evaluation of the
gestation as well as the maternal uterus and adnexal regions.

[Series 1: us ob comp less 14 wk · 15 of 40 slices shown]
[im 1/40]
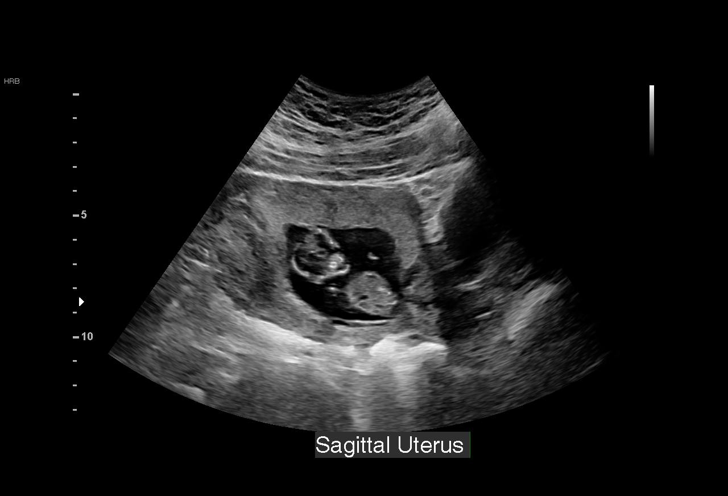
[im 3/40]
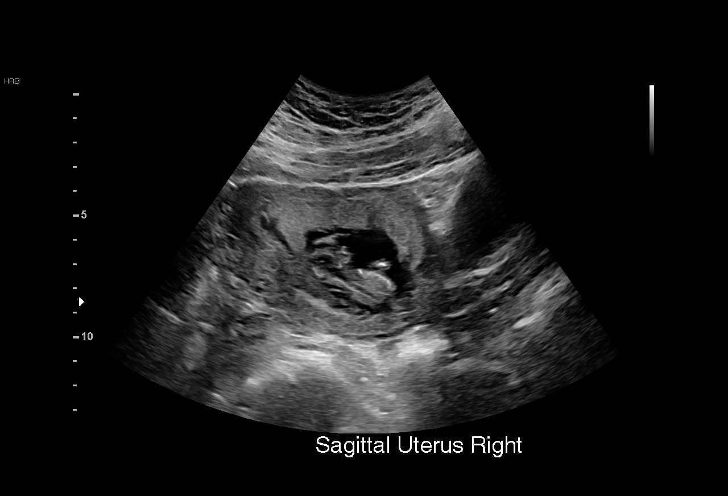
[im 6/40]
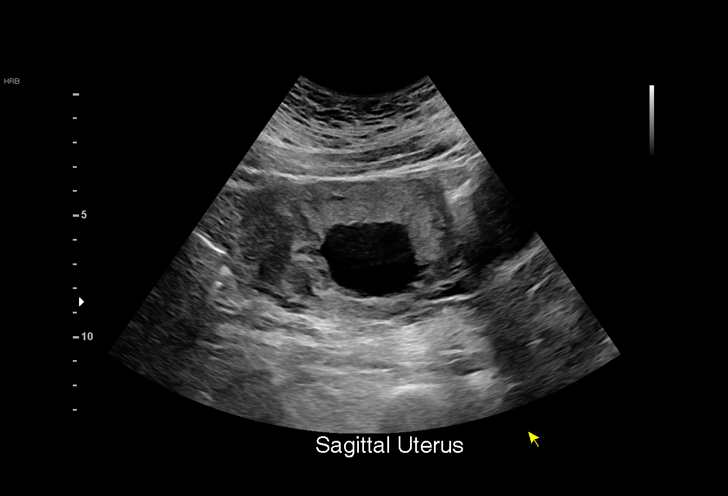
[im 9/40]
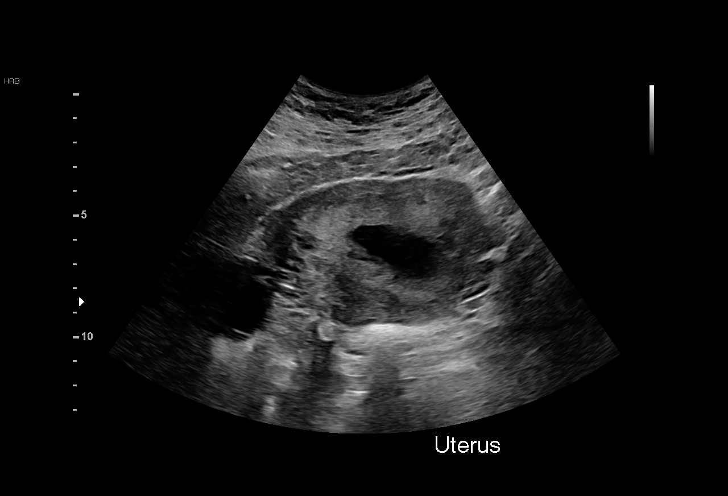
[im 12/40]
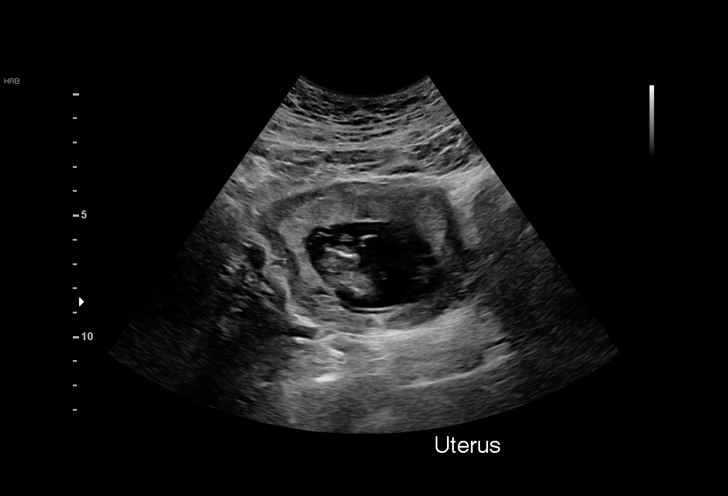
[im 15/40]
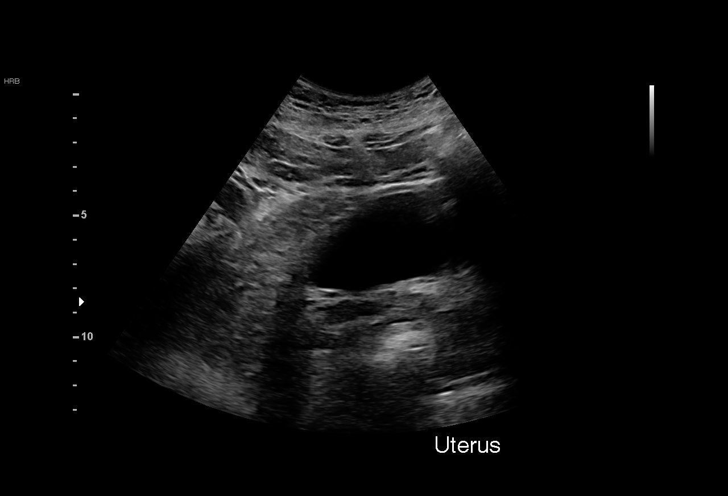
[im 18/40]
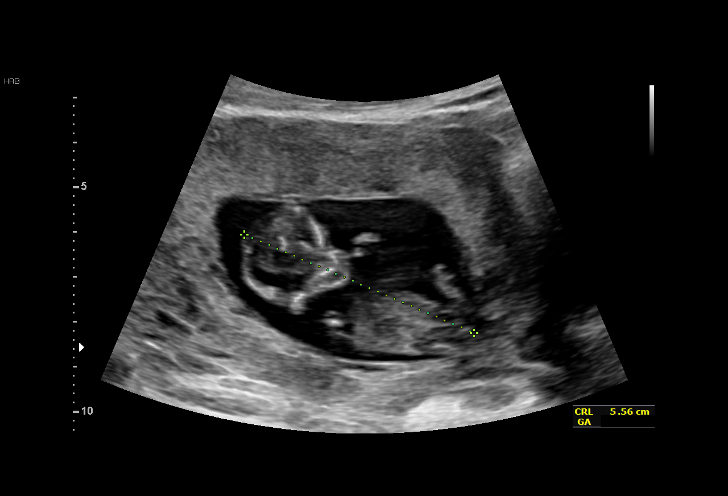
[im 21/40]
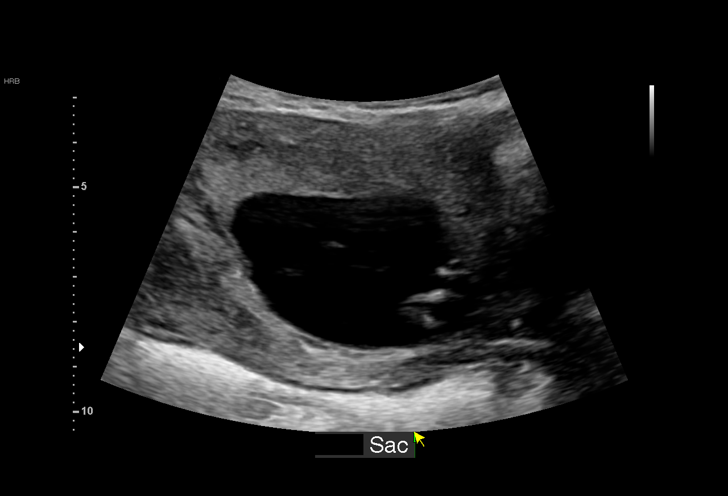
[im 22/40]
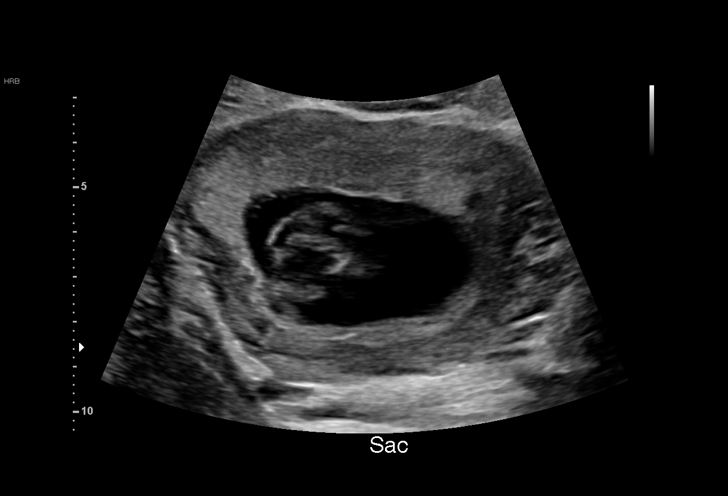
[im 25/40]
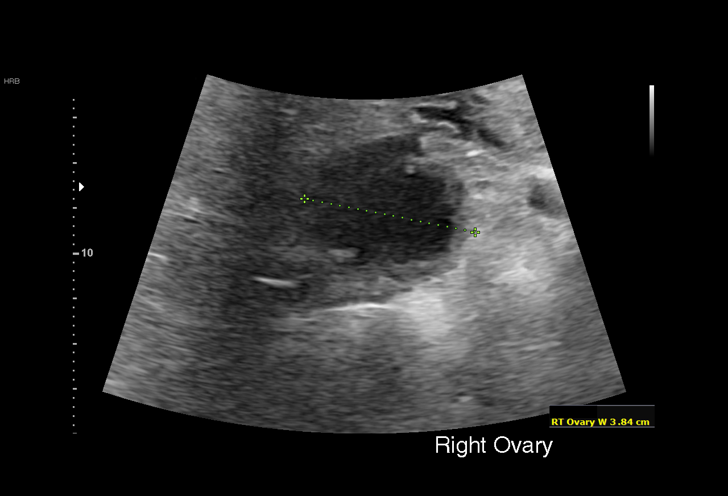
[im 28/40]
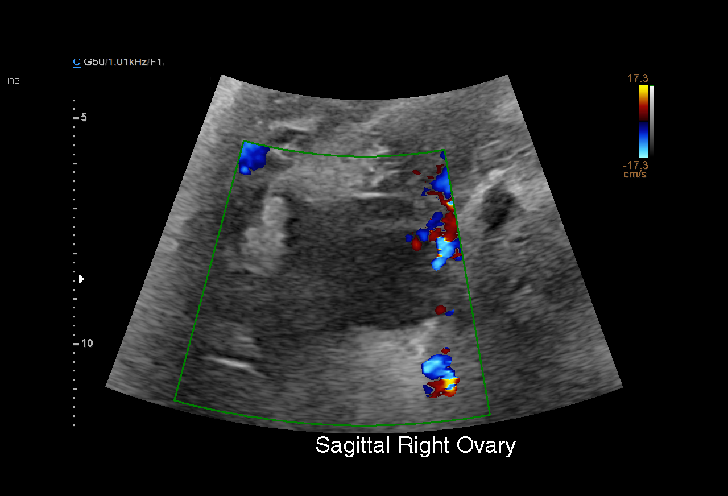
[im 31/40]
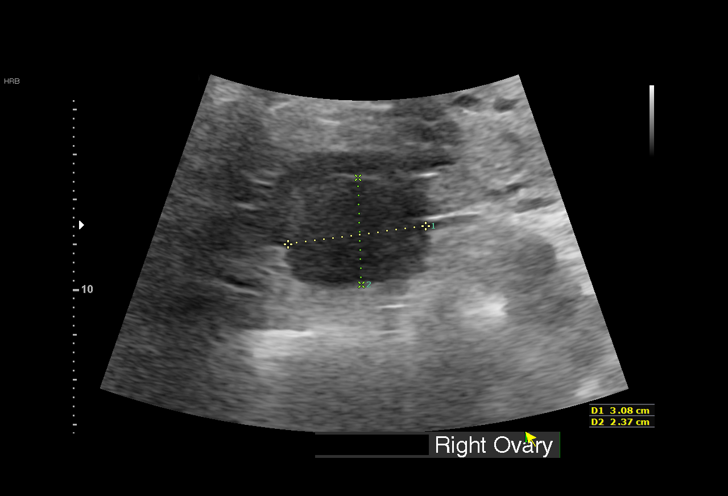
[im 34/40]
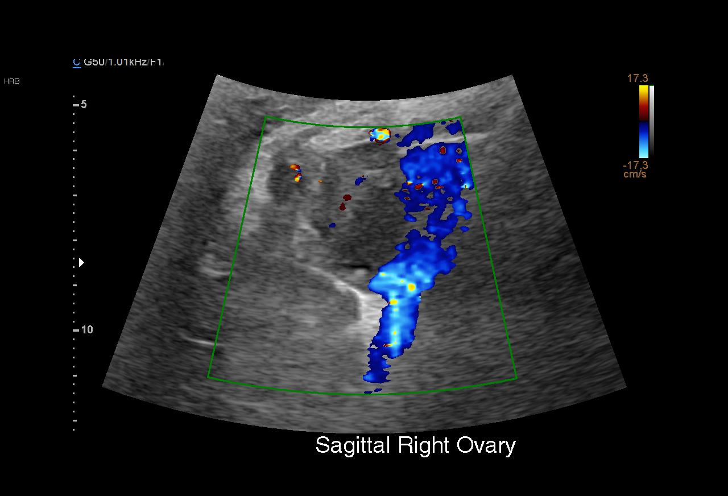
[im 37/40]
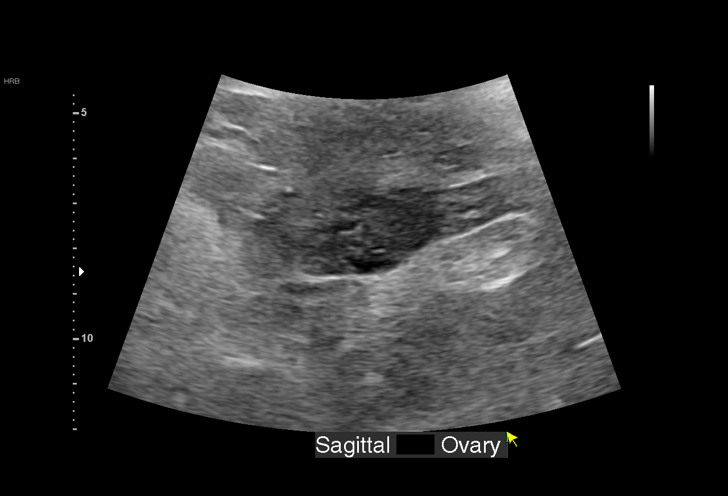
[im 40/40]
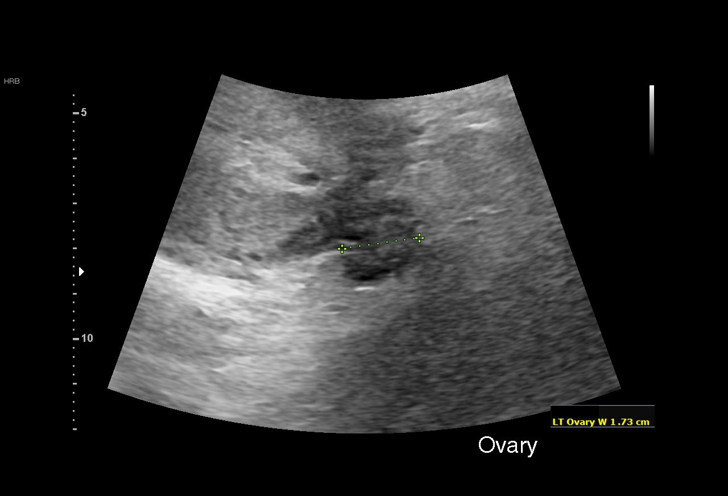

[15 of 28 positions shown; findings below may reference images not displayed]

FINDINGS: Intrauterine gestational sac: Single intrauterine gestational sac
appears normal in size, shape and position.

Yolk sac:  Not Visualized.

Fetus: Visualized. Fetal anatomy not assessed at this early
gestational age.

Fetal Cardiac Activity: Regular rate and rhythm.

Fetal Heart Rate: 159 bpm

CRL:   54.2  mm   12 w 0 d                  US EDC: 05/29/2017

Subchorionic hemorrhage:  None visualized.

Maternal uterus/adnexae: Right ovary measures 4.7 x 2.9 x 3.8 cm and
contains a 3.1 cm corpus luteal cyst. Left ovary measures 3.5 x
x 1.7 cm. No abnormal ovarian or adnexal mass. No uterine fibroids
demonstrated. No abnormal free fluid in the pelvis.
IMPRESSION: 1. Single living intrauterine gestation at 12 weeks 0 days by
crown-rump length, mildly discrepant with the expected gestational
age of 13 weeks 3 days by provided menstrual dating. Appropriate
fetal growth can be assessed on dedicated fetal anatomy scan in 6-10
weeks.
2. No acute first-trimester gestational abnormality.

## 2018-04-27 IMAGING — US US MFM OB COMP +14 WKS
1 series · 14 of 28 positions shown · non-contrast
Comparison: none

[Series 1: us mfm ob comp +14 wks · 14 of 76 slices shown]
[im 3/76]
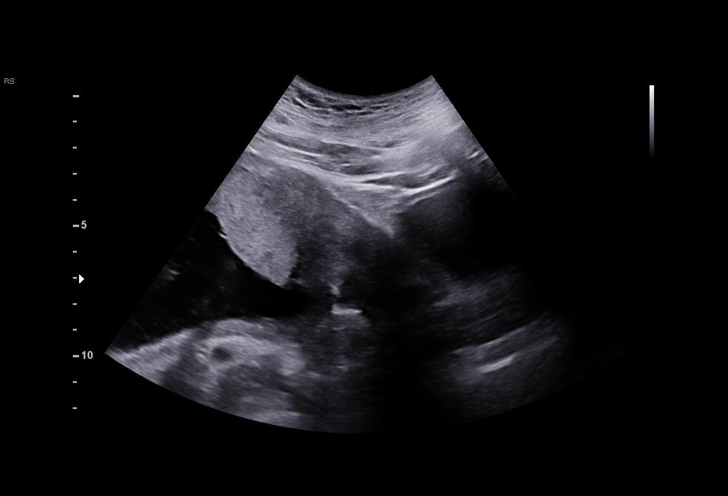
[im 9/76]
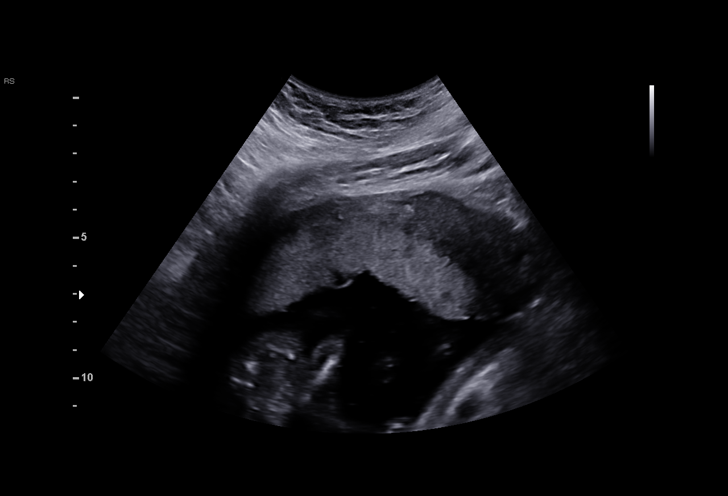
[im 14/76]
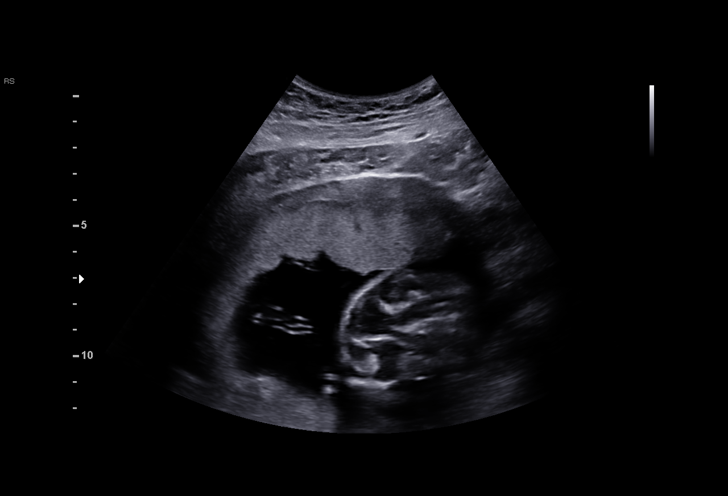
[im 20/76]
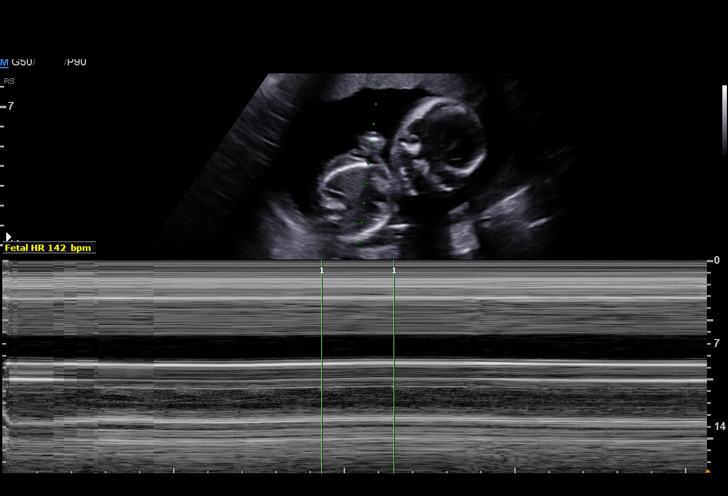
[im 26/76]
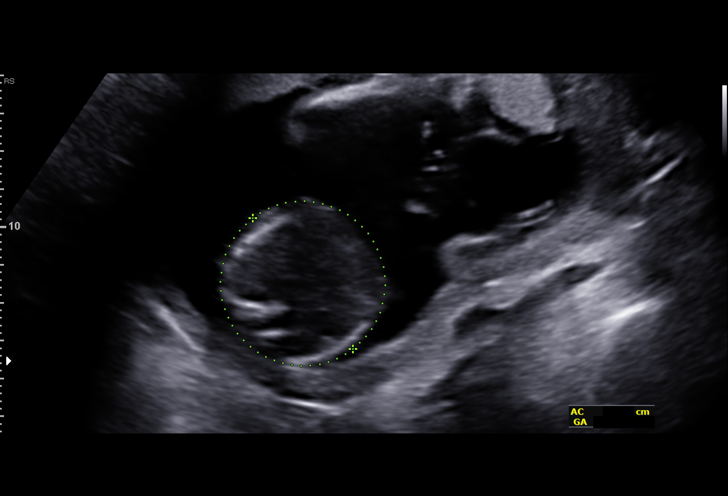
[im 31/76]
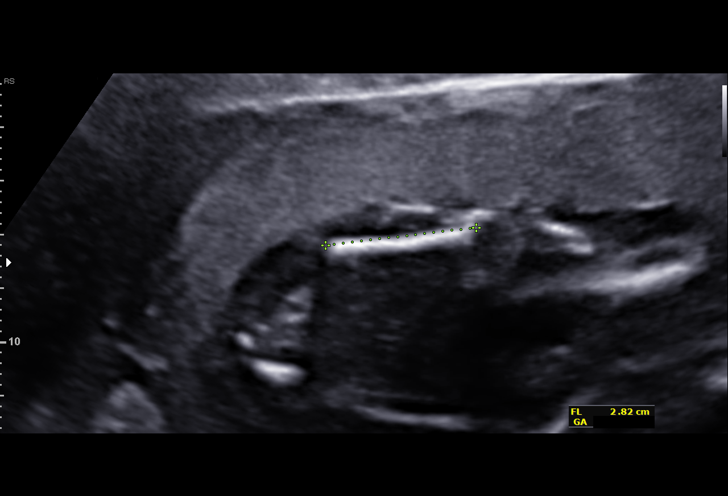
[im 37/76]
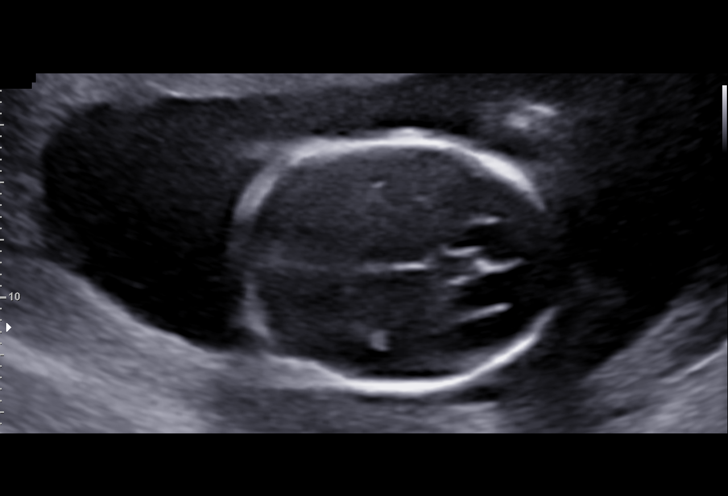
[im 42/76]
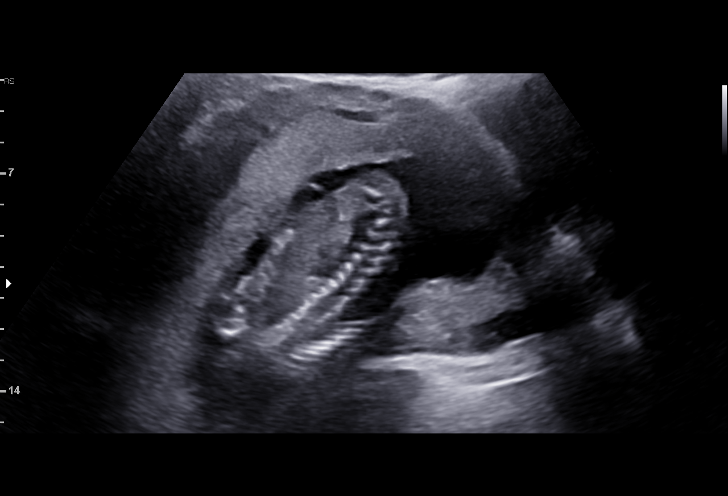
[im 48/76]
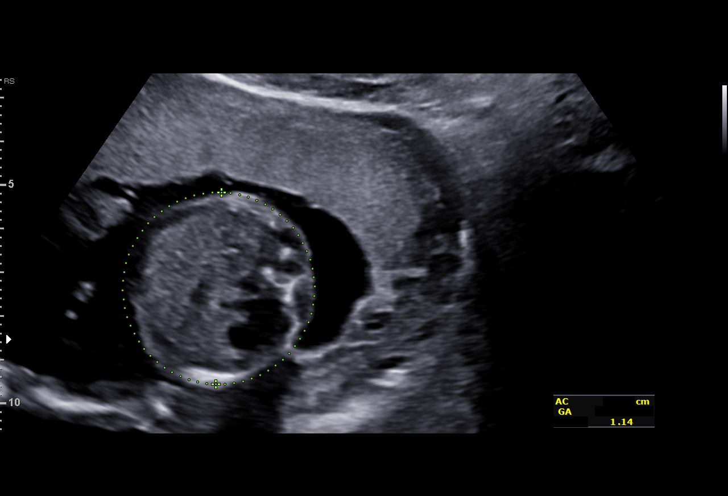
[im 53/76]
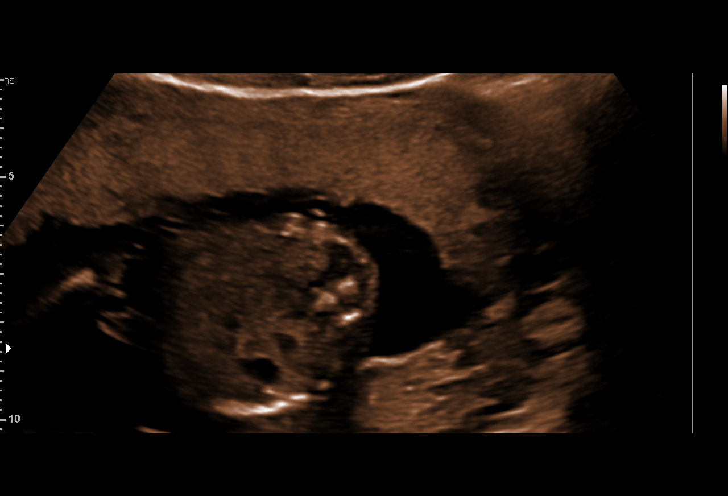
[im 59/76]
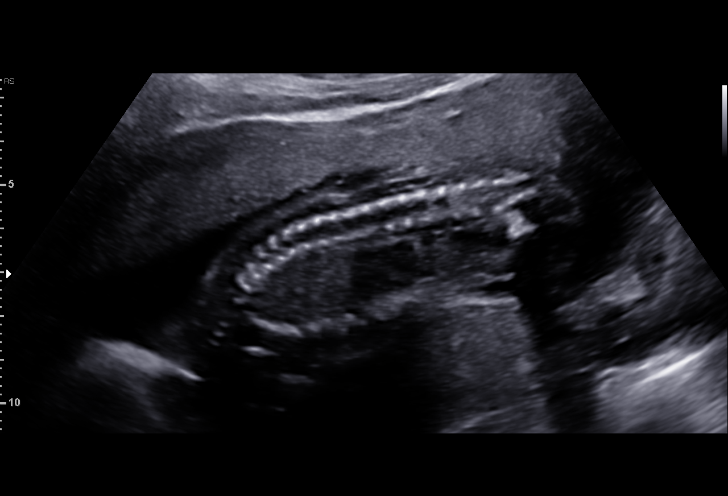
[im 64/76]
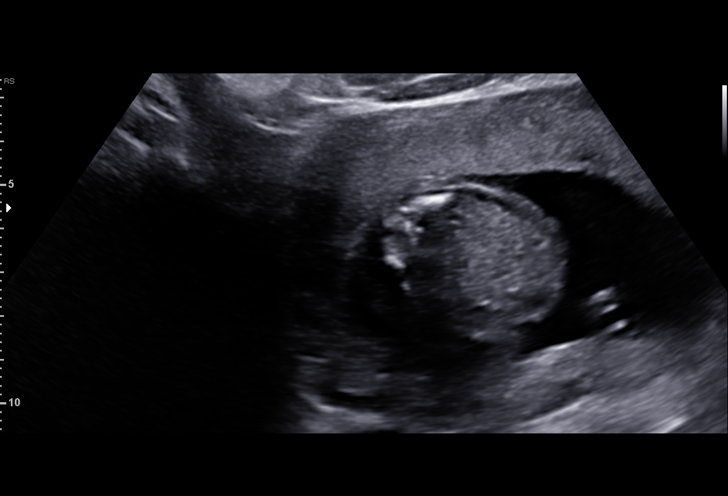
[im 70/76]
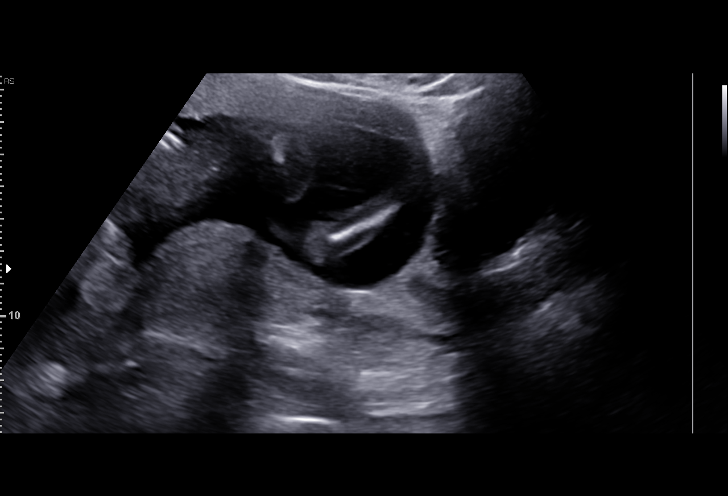
[im 76/76]
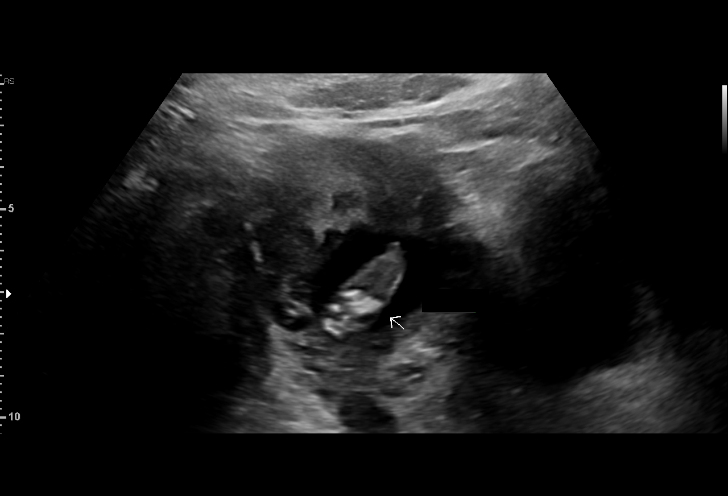

[14 of 28 positions shown; findings below may reference images not displayed]

1  KIUN-KIUN TEDJA           [id number]      [id number]     [id number]
Indications

19 weeks gestation of pregnancy
Encounter for antenatal screening for
malformations
Pregnancy complicated by previous gastric
bypass, antepartum, second trimester
Obesity complicating pregnancy, second
trimester
OB History

Gravidity:    1
Fetal Evaluation

Num Of Fetuses:     1
Fetal Heart         142
Rate(bpm):
Cardiac Activity:   Observed
Presentation:       Variable
Placenta:           Anterior, above cervical os
P. Cord Insertion:  Visualized

Amniotic Fluid
AFI FV:      Subjectively within normal limits

Largest Pocket(cm)
5.66
Biometry

BPD:      42.1  mm     G. Age:  18w 5d         39  %    CI:        72.08   %   70 - 86
FL/HC:      18.3   %   16.1 -
HC:      157.8  mm     G. Age:  18w 5d         26  %    HC/AC:      1.15       1.09 -
AC:      137.1  mm     G. Age:  19w 1d         51  %    FL/BPD:     68.4   %
FL:       28.8  mm     G. Age:  18w 6d         38  %    FL/AC:      21.0   %   20 - 24
HUM:      26.6  mm     G. Age:  18w 3d         34  %

Est. FW:     267  gm      0 lb 9 oz     44  %
Gestational Age

LMP:           20w 3d       Date:   08/12/16                 EDD:   05/19/17
U/S Today:     18w 6d                                        EDD:   05/30/17
Best:          19w 0d    Det. By:   Early Ultrasound         EDD:   05/29/17
(11/14/16)
Anatomy

Cranium:               Appears normal         Aortic Arch:            Not well visualized
Cavum:                 Appears normal         Ductal Arch:            Not well visualized
Ventricles:            Not well visualized    Diaphragm:              Appears normal
Choroid Plexus:        Appears normal         Stomach:                Appears normal, left
sided
Cerebellum:            Not well visualized    Abdomen:                Appears normal
Posterior Fossa:       Not well visualized    Abdominal Wall:         Appears nml (cord
insert, abd wall)
Nuchal Fold:           Not well visualized    Cord Vessels:           Appears normal (3
vessel cord)
Face:                  Not well visualized    Kidneys:                Appear normal
Lips:                  Not well visualized    Bladder:                Appears normal
Thoracic:              Appears normal         Spine:                  Limited views
appear normal
Heart:                 Not well visualized    Upper Extremities:      Appears normal
RVOT:                  Not well visualized    Lower Extremities:      Appears normal
LVOT:                  Not well visualized
Cervix Uterus Adnexa

Cervix
Length:           3.11  cm.
Normal appearance by transabdominal scan.

Uterus
No abnormality visualized.

Left Ovary
Not visualized.

Right Ovary
Not visualized.

Adnexa:       No abnormality visualized. No adnexal mass
visualized.
Impression

Singleton intrauterine pregnancy at 19+0 weeks, here for
anatomic survey
Review of the anatomy shows no sonographic markers for
aneuploidy or structural anomalies
However, evaluations should be considered suboptimal
secondary to maternal body habitus and fetal position
Amniotic fluid volume is normal
Estimated fetal weight is 267g which is growth in the 44th
percentile
Recommendations

Repeat scan in 4 weeks to complete anatomic survey

## 2018-06-03 IMAGING — US US MFM OB FOLLOW-UP
1 series · 14 of 28 positions shown · non-contrast
Comparison: none

[Series 1: us mfm ob follow-up · 14 of 60 slices shown]
[im 3/60]
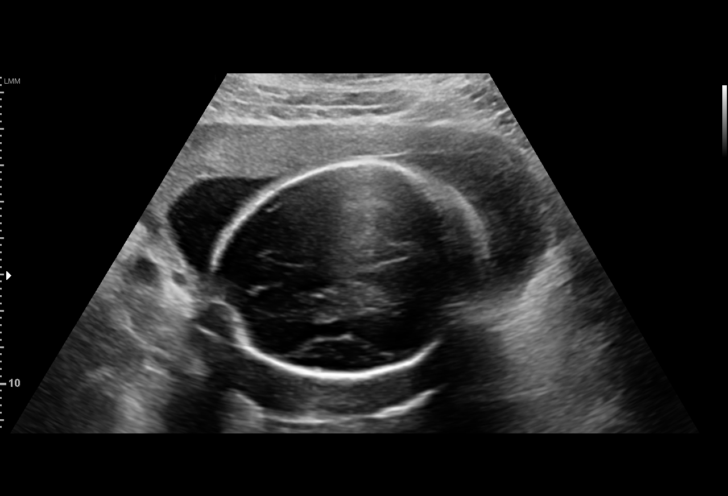
[im 7/60]
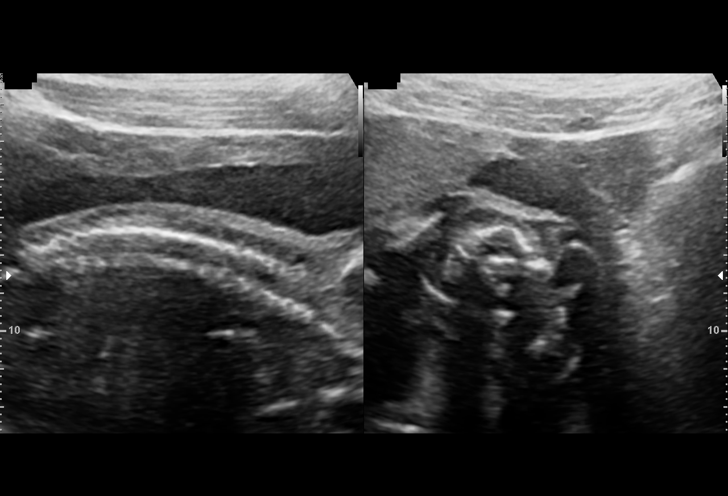
[im 11/60]
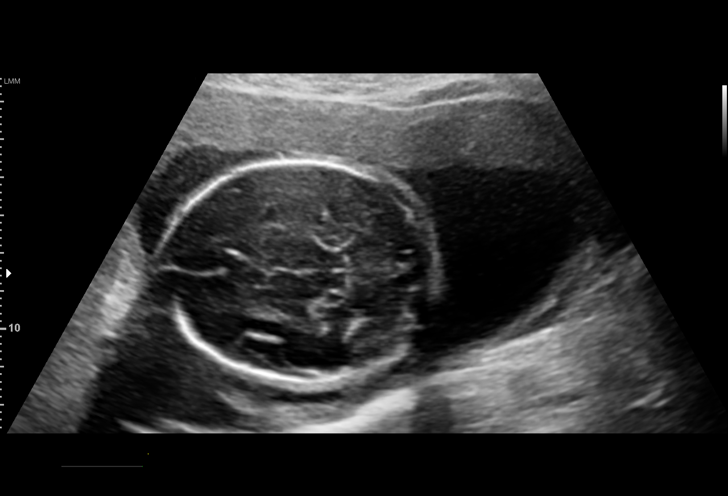
[im 16/60]
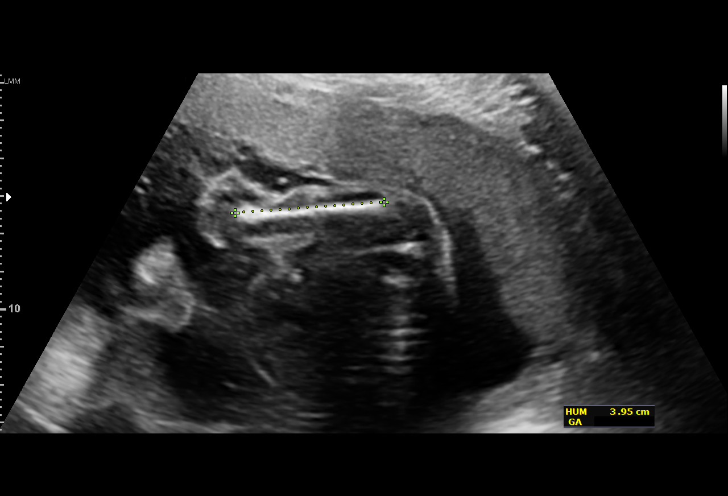
[im 20/60]
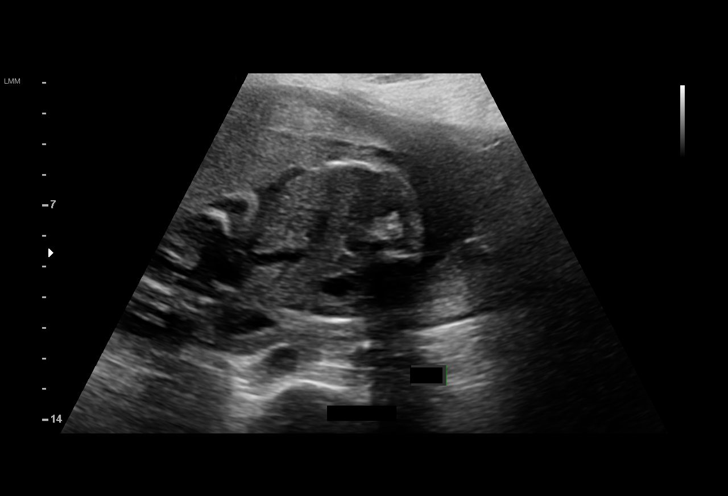
[im 25/60]
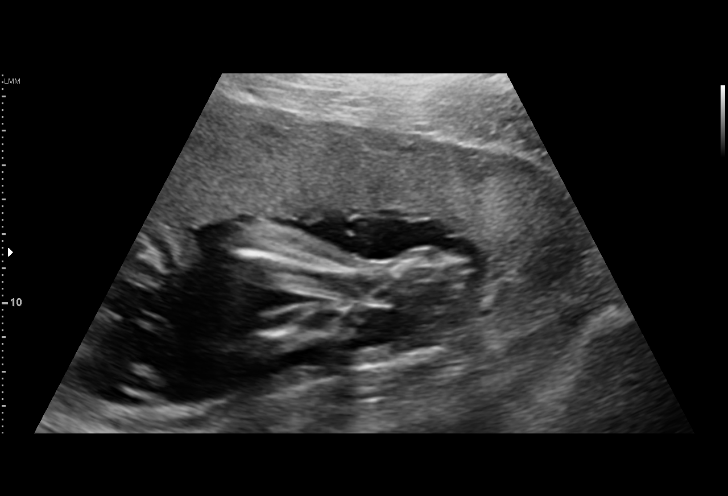
[im 29/60]
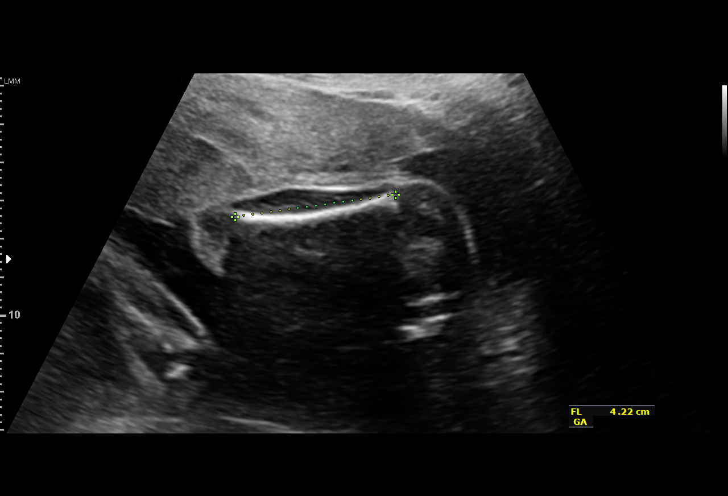
[im 33/60]
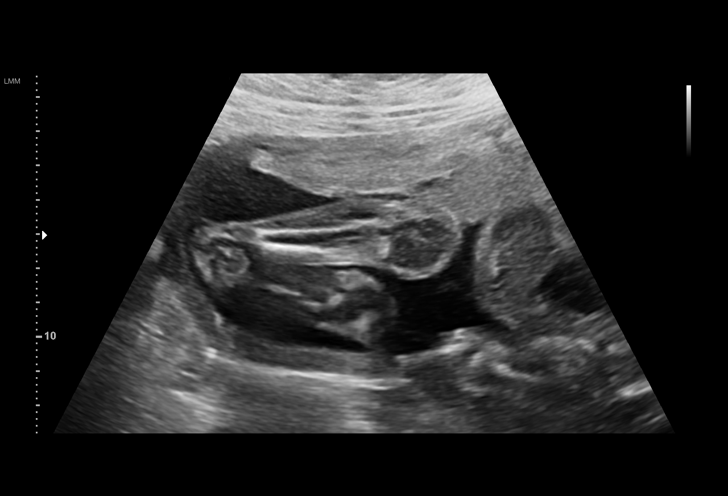
[im 38/60]
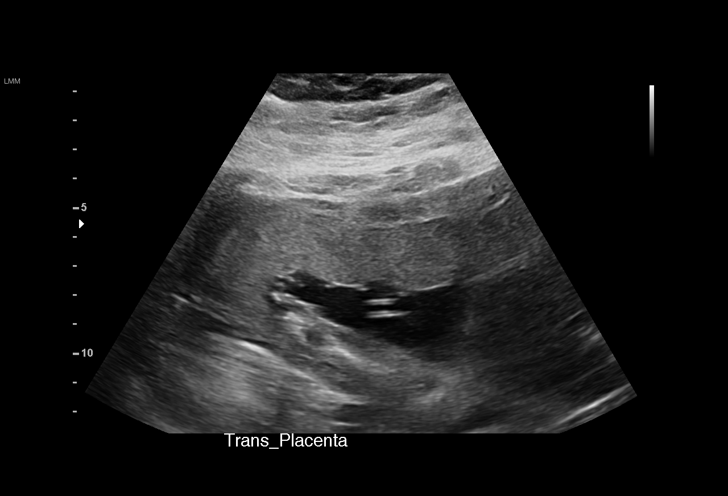
[im 42/60]
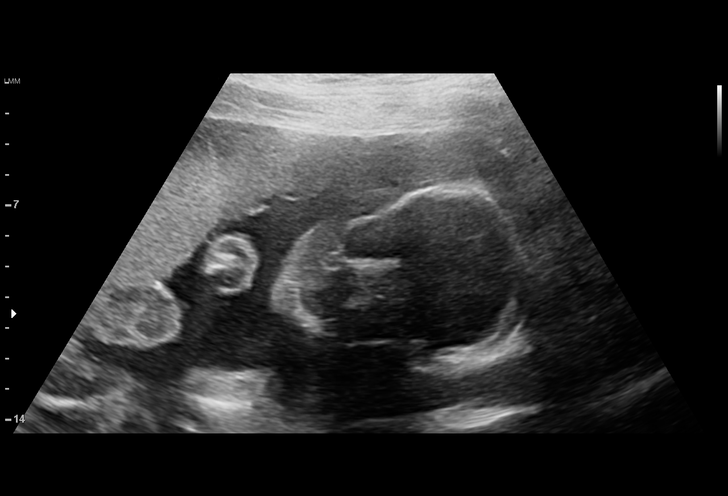
[im 46/60]
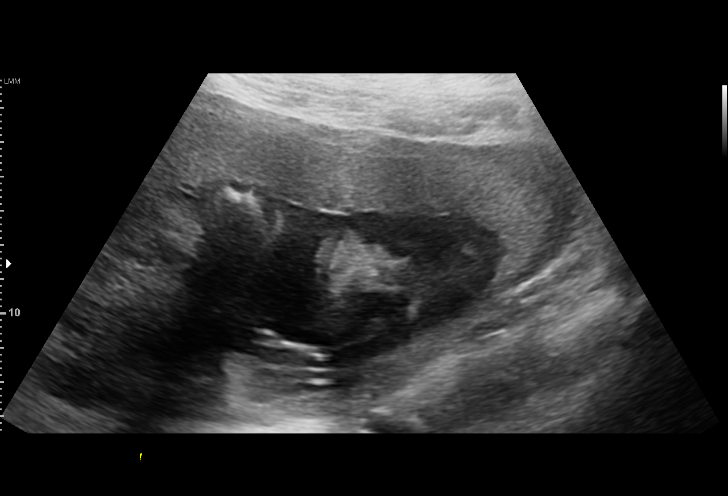
[im 51/60]
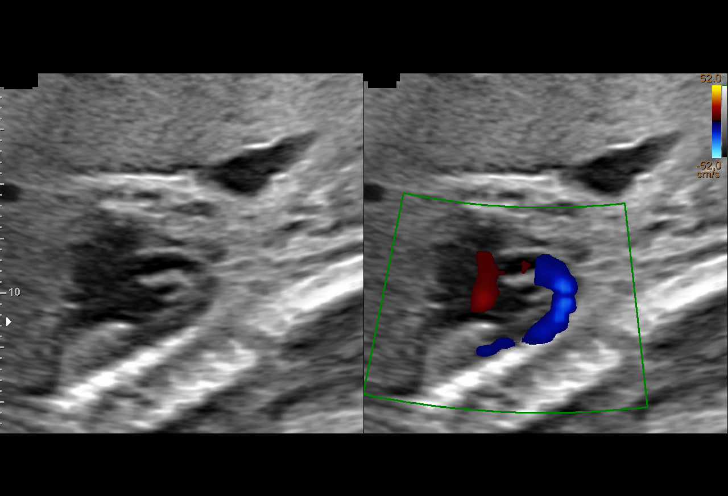
[im 55/60]
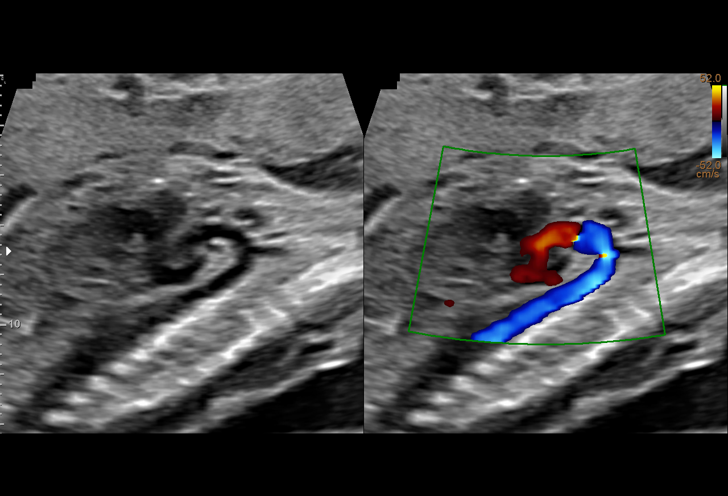
[im 60/60]
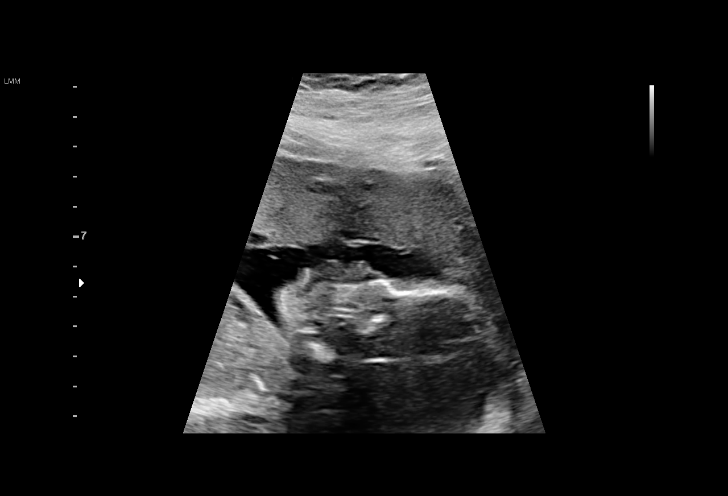

[14 of 28 positions shown; findings below may reference images not displayed]

Road [HOSPITAL]

Indications

24 weeks gestation of pregnancy
Encounter for other antenatal screening
follow-up
OB History

Gravidity:    1
Fetal Evaluation

Num Of Fetuses:     1
Fetal Heart         144
Rate(bpm):
Cardiac Activity:   Observed
Presentation:       Cephalic
Placenta:           Anterior, above cervical os
P. Cord Insertion:  Visualized, central

Amniotic Fluid
AFI FV:      Subjectively within normal limits

Largest Pocket(cm)
5.33
Biometry

BPD:      57.8  mm     G. Age:  23w 5d         23  %    CI:        72.51   %    70 - 86
FL/HC:      19.1   %    18.7 -
HC:      215.9  mm     G. Age:  23w 4d         13  %    HC/AC:      1.11        1.05 -
AC:       195   mm     G. Age:  24w 1d         39  %    FL/BPD:     71.3   %    71 - 87
FL:       41.2  mm     G. Age:  23w 3d         14  %    FL/AC:      21.1   %    20 - 24
HUM:      39.5  mm     G. Age:  24w 1d         38  %
CER:      25.6  mm     G. Age:  23w 4d         31  %
CM:        6.1  mm

Est. FW:     631  gm      1 lb 6 oz     41  %
Gestational Age

LMP:           25w 5d        Date:  08/12/16                 EDD:   05/19/17
U/S Today:     23w 5d                                        EDD:   06/02/17
Best:          24w 2d     Det. By:  Early Ultrasound         EDD:   05/29/17
(11/14/16)
Anatomy

Cranium:               Appears normal         Aortic Arch:            Appears normal
Cavum:                 Appears normal         Ductal Arch:            Appears normal
Ventricles:            Appears normal         Diaphragm:              Previously seen
Choroid Plexus:        Appears normal         Stomach:                Appears normal, left
sided
Cerebellum:            Appears normal         Abdomen:                Appears normal
Posterior Fossa:       Appears normal         Abdominal Wall:         Previously seen
Nuchal Fold:           Not applicable (>20    Cord Vessels:           Previously seen
wks GA)
Face:                  Orbits appear          Kidneys:                Appear normal
normal
Lips:                  Appears normal         Bladder:                Appears normal
Thoracic:              Appears normal         Spine:                  Appears normal
Heart:                 Appears normal         Upper Extremities:      Previously seen
(4CH, axis, and situs
RVOT:                  Appears normal         Lower Extremities:      Previously seen
LVOT:                  Appears normal

Other:  Fetus appears to be a male. Technically difficult due to maternal
habitus.
Cervix Uterus Adnexa

Cervix
Length:           4.28  cm.
Normal appearance by transabdominal scan.

Uterus
No abnormality visualized.
Impression

Singleton intrauterine pregnancy at 24 weeks 2 days
gestation with fetal cardiac activity
Cephalic presentation
Anterior placenta without evidence of previa
Normal appearing fetal growth and amniotic fluid volume
Completion of fetal anatomic survey
Normal appearing cervical length
Recommendations

Follow-up ultrasounds as clinically indicated.

## 2024-03-15 ENCOUNTER — Other Ambulatory Visit: Payer: Self-pay

## 2024-03-15 ENCOUNTER — Emergency Department
Admission: EM | Admit: 2024-03-15 | Discharge: 2024-03-15 | Disposition: A | Discharge status: Home / Self Care | Attending: Emergency Medicine | Admitting: Emergency Medicine

## 2024-03-15 ENCOUNTER — Emergency Department

## 2024-03-15 DIAGNOSIS — J45909 Unspecified asthma, uncomplicated: Secondary | ICD-10-CM | POA: Diagnosis not present

## 2024-03-15 DIAGNOSIS — F10120 Alcohol abuse with intoxication, uncomplicated: Secondary | ICD-10-CM | POA: Diagnosis not present

## 2024-03-15 DIAGNOSIS — S3992XA Unspecified injury of lower back, initial encounter: Secondary | ICD-10-CM | POA: Diagnosis present

## 2024-03-15 DIAGNOSIS — W108XXA Fall (on) (from) other stairs and steps, initial encounter: Secondary | ICD-10-CM | POA: Insufficient documentation

## 2024-03-15 DIAGNOSIS — Y905 Blood alcohol level of 100-119 mg/100 ml: Secondary | ICD-10-CM | POA: Diagnosis not present

## 2024-03-15 DIAGNOSIS — S20411A Abrasion of right back wall of thorax, initial encounter: Secondary | ICD-10-CM | POA: Insufficient documentation

## 2024-03-15 DIAGNOSIS — W19XXXA Unspecified fall, initial encounter: Secondary | ICD-10-CM

## 2024-03-15 DIAGNOSIS — S30810A Abrasion of lower back and pelvis, initial encounter: Secondary | ICD-10-CM | POA: Insufficient documentation

## 2024-03-15 DIAGNOSIS — E119 Type 2 diabetes mellitus without complications: Secondary | ICD-10-CM | POA: Diagnosis not present

## 2024-03-15 DIAGNOSIS — M549 Dorsalgia, unspecified: Secondary | ICD-10-CM

## 2024-03-15 DIAGNOSIS — F10920 Alcohol use, unspecified with intoxication, uncomplicated: Secondary | ICD-10-CM

## 2024-03-15 LAB — CBC WITH DIFFERENTIAL/PLATELET
Abs Immature Granulocytes: 0.03 K/uL (ref 0.00–0.07)
Basophils Absolute: 0.1 K/uL (ref 0.0–0.1)
Basophils Relative: 1 %
Eosinophils Absolute: 0.1 K/uL (ref 0.0–0.5)
Eosinophils Relative: 2 %
HCT: 38.7 % (ref 36.0–46.0)
Hemoglobin: 12.6 g/dL (ref 12.0–15.0)
Immature Granulocytes: 0 %
Lymphocytes Relative: 53 %
Lymphs Abs: 5.1 K/uL — ABNORMAL HIGH (ref 0.7–4.0)
MCH: 27 pg (ref 26.0–34.0)
MCHC: 32.6 g/dL (ref 30.0–36.0)
MCV: 83 fL (ref 80.0–100.0)
Monocytes Absolute: 0.5 K/uL (ref 0.1–1.0)
Monocytes Relative: 6 %
Neutro Abs: 3.6 K/uL (ref 1.7–7.7)
Neutrophils Relative %: 38 %
Platelets: 449 K/uL — ABNORMAL HIGH (ref 150–400)
RBC: 4.66 MIL/uL (ref 3.87–5.11)
RDW: 15.7 % — ABNORMAL HIGH (ref 11.5–15.5)
WBC: 9.4 K/uL (ref 4.0–10.5)
nRBC: 0 % (ref 0.0–0.2)

## 2024-03-15 LAB — LIPASE, BLOOD: Lipase: 49 U/L (ref 11–51)

## 2024-03-15 LAB — COMPREHENSIVE METABOLIC PANEL WITH GFR
ALT: 18 U/L (ref 0–44)
AST: 16 U/L (ref 15–41)
Albumin: 3.6 g/dL (ref 3.5–5.0)
Alkaline Phosphatase: 34 U/L — ABNORMAL LOW (ref 38–126)
Anion gap: 11 (ref 5–15)
BUN: 12 mg/dL (ref 6–20)
CO2: 19 mmol/L — ABNORMAL LOW (ref 22–32)
Calcium: 8.9 mg/dL (ref 8.9–10.3)
Chloride: 107 mmol/L (ref 98–111)
Creatinine, Ser: 0.74 mg/dL (ref 0.44–1.00)
GFR, Estimated: >60 mL/min (ref >60)
Glucose, Bld: 124 mg/dL — ABNORMAL HIGH (ref 70–99)
Potassium: 3.6 mmol/L (ref 3.5–5.1)
Sodium: 137 mmol/L (ref 135–145)
Total Bilirubin: 0.4 mg/dL (ref 0.0–1.2)
Total Protein: 8.2 g/dL — ABNORMAL HIGH (ref 6.5–8.1)

## 2024-03-15 LAB — ETHANOL: Alcohol, Ethyl (B): 102 mg/dL — ABNORMAL HIGH (ref <15)

## 2024-03-15 LAB — HCG, QUANTITATIVE, PREGNANCY: hCG, Beta Chain, Quant, S: <1 m[IU]/mL (ref <5)

## 2024-03-15 MED ORDER — IOHEXOL 300 MG/ML  SOLN
100.0000 mL | Freq: Once | INTRAMUSCULAR | Status: AC | PRN
  Administered 2024-03-15: 100 mL via INTRAVENOUS

## 2024-03-15 MED ORDER — SODIUM CHLORIDE 0.9 % IV BOLUS (SEPSIS)
1000.0000 mL | Freq: Once | INTRAVENOUS | Status: AC
  Administered 2024-03-15: 1000 mL via INTRAVENOUS

## 2024-03-15 MED ORDER — TRAMADOL HCL 50 MG PO TABS: 50.0000 mg | ORAL_TABLET | Freq: Three times a day (TID) | ORAL | 0 refills | Status: AC | PRN

## 2024-03-15 MED ORDER — IBUPROFEN 800 MG PO TABS: 800.0000 mg | ORAL_TABLET | Freq: Three times a day (TID) | ORAL | 0 refills | Status: AC | PRN

## 2024-03-15 MED ORDER — ONDANSETRON 4 MG PO TBDP: 4.0000 mg | ORAL_TABLET | Freq: Three times a day (TID) | ORAL | 0 refills | Status: AC | PRN

## 2024-03-15 MED ORDER — KETOROLAC TROMETHAMINE 30 MG/ML IJ SOLN
30.0000 mg | Freq: Once | INTRAMUSCULAR | Status: AC
  Administered 2024-03-15: 30 mg via INTRAVENOUS
  Filled 2024-03-15: qty 1

## 2024-03-15 MED ORDER — TRAMADOL HCL 50 MG PO TABS
50.0000 mg | ORAL_TABLET | Freq: Once | ORAL | Status: AC
  Administered 2024-03-15: 50 mg via ORAL
  Filled 2024-03-15: qty 1

## 2024-03-15 MED ORDER — ONDANSETRON 4 MG PO TBDP
4.0000 mg | ORAL_TABLET | Freq: Once | ORAL | Status: AC
  Administered 2024-03-15: 4 mg via ORAL
  Filled 2024-03-15: qty 1

## 2024-03-15 NOTE — ED Triage Notes (Signed)
 Pt to ED via [facility] from [facility] [place] [address]. Per EMS pt was at [employer], [employer], when she fell and landed on her back. Per EMS pt fell on brick steps and hit mid back. Pt appears in distress due to pain on arrival. Pt appears very uncomfortable with any movement/palpation. Pt with some hyperventilation noted on arrival, pt also noted to be tearful.  Per EMS HR 107, BP 140 systolic.  Pt endorses ETOH, denies drug use.

## 2024-03-15 NOTE — ED Provider Notes (Signed)
 [facility] Provider Note    Event Date/Time   First MD Initiated Contact with Patient 03/15/24 0126     (approximate)   History   Fall and Back Pain   HPI  Sara Mullins is a 38 y.o. female with history of obesity, diabetes, asthma, PTSD who presents to the emergency department after she slipped on wet stairs and hit her mid and lower back and right ribs and abdomen on brick stairs.  She is not sure if she hit her head.  She has been drinking alcohol tonight.  No drug use.  Unsure if she lost consciousness.  Not on blood thinners.  Complaining of severe pain.   History provided by patient and EMS.    Past Medical History:  Diagnosis Date   Asthma    Diabetes mellitus without complication (HCC)    Headache    PTSD (post-traumatic stress disorder)    PTSD (post-traumatic stress disorder)     Past Surgical History:  Procedure Laterality Date   GASTRIC BYPASS     LAPAROSCOPIC GASTRIC SLEEVE RESECTION      MEDICATIONS:  Prior to Admission medications   Medication Sig Start Date End Date Taking? Authorizing Provider  acetaminophen  (TYLENOL ) 325 MG tablet Take 650 mg by mouth every 6 (six) hours as needed for mild pain.     [provider]  albuterol  (PROVENTIL  HFA;VENTOLIN  HFA) 108 (90 Base) MCG/ACT inhaler Inhale 2 puffs into the lungs every 6 (six) hours as needed for wheezing or shortness of breath. 12/14/16   Rudy Carlin LABOR, MD  bifidobacterium infantis (ALIGN) capsule Take 1 capsule by mouth daily. 12/26/16   Trudy Earnie CROME, CNM  Butalbital -APAP-Caffeine  [phone] MG capsule Take 1-2 capsules by mouth every 6 (six) hours as needed for headache. 12/26/16   Trudy Earnie CROME, CNM  Cholecalciferol (VITAMIN D ) 2000 units tablet Take 1 tablet (2,000 Units total) by mouth daily. Patient not taking: Reported on 12/13/2016 11/22/16   Rudy Carlin LABOR, MD  cyclobenzaprine  (FLEXERIL ) 10 MG tablet Take 1 tablet (10 mg total) by mouth every 8  (eight) hours as needed for muscle spasms. 01/09/17   Marguerite Caddy A, CNM  Doxylamine -Pyridoxine  (DICLEGIS ) 10-10 MG TBEC Take 1 tablet with breakfast and lunch.  Take 2 tablets at bedtime. 12/11/16   Marguerite Caddy A, CNM  hyoscyamine  (LEVSIN , ANASPAZ ) 0.125 MG tablet Take 1 tablet (0.125 mg total) by mouth every 6 (six) hours as needed for cramping. 12/26/16   Trudy Earnie CROME, CNM  metoCLOPramide  (REGLAN ) 10 MG tablet Take 1 tablet (10 mg total) by mouth 3 (three) times daily with meals. Patient not taking: Reported on 12/13/2016 10/24/16   Teresia Rakers N, CNM  ondansetron  (ZOFRAN  ODT) 4 MG disintegrating tablet Take 1 tablet (4 mg total) by mouth every 8 (eight) hours as needed for nausea or vomiting. 12/08/16   Gerlean Earnie BIRCH, CNM  Prenat-FeFum-FePo-FA-Omega 3 (CONCEPT DHA ) 53.5-38-1 MG CAPS Take 1 tablet by mouth daily. 10/24/16   Karim-Rhoades, Rakers SAILOR, CNM  Prenatal-DSS-FeCb-FeGl-FA (CITRANATAL  BLOOM) 90-1 MG TABS Take 1 tablet by mouth daily before breakfast. 11/16/16   Rudy Carlin LABOR, MD  promethazine  (PHENERGAN ) 12.5 MG tablet Take 1 tablet (12.5 mg total) by mouth every 6 (six) hours as needed for nausea or vomiting. 12/11/16   Marguerite Caddy A, CNM  sertraline  (ZOLOFT ) 100 MG tablet Take 1 tablet (100 mg total) by mouth daily. 01/09/17   Marguerite Caddy A, CNM  Simethicone  80 MG TABS Take 1 tablet (  80 mg total) by mouth daily. 12/26/16   Trudy Earnie CROME, CNM    Physical Exam   Triage Vital Signs: ED Triage Vitals  Encounter Vitals Group     BP 03/15/24 0132 (!) 153/114     Girls Systolic BP Percentile --      Girls Diastolic BP Percentile --      Boys Systolic BP Percentile --      Boys Diastolic BP Percentile --      Pulse Rate 03/15/24 0132 (!) 105     Resp 03/15/24 0132 (!) 25     Temp 03/15/24 0132 98.4 F (36.9 C)     Temp Source 03/15/24 0132 Oral     SpO2 03/15/24 0132 100 %     Weight 03/15/24 0130 275 lb (124.7 kg)     Height 03/15/24 0130 5' 3 (1.6 m)      Head Circumference --      Peak Flow --      Pain Score 03/15/24 0130 10     Pain Loc --      Pain Education --      Exclude from Growth Chart --     Most recent vital signs: Vitals:   03/15/24 0202 03/15/24 0539  BP: 130/83 (!) 121/92  Pulse: 93 83  Resp: 13 18  Temp:  98.1 F (36.7 C)  SpO2: 100% 100%     CONSTITUTIONAL: Alert, responds appropriately to questions.  Intoxicated, tearful HEAD: Normocephalic; atraumatic EYES: Conjunctivae clear, PERRL, EOMI ENT: normal nose; no rhinorrhea; moist mucous membranes; pharynx without lesions noted; no dental injury; no septal hematoma, no epistaxis; no facial deformity or bony tenderness NECK: Supple, no midline spinal tenderness, step-off or deformity; trachea midline CARD: Regular and tachycardic; S1 and S2 appreciated; no murmurs, no clicks, no rubs, no gallops RESP: Normal chest excursion without splinting or tachypnea; breath sounds clear and equal bilaterally; no wheezes, no rhonchi, no rales; no hypoxia or respiratory distress CHEST:  chest wall stable, no crepitus or ecchymosis or deformity, nontender to palpation; no flail chest ABD/GI: Non-distended; soft, tender in the right upper and mid abdomen without guarding or rebound, no ecchymosis PELVIS:  stable, nontender to palpation BACK: Abrasions across the lower thoracic and upper lumbar area extending over the right flank and right lower posterior ribs.  No ecchymosis.  Does have some soft tissue swelling in this area. EXT: Normal ROM in all joints; no edema; normal capillary refill; no cyanosis, no bony tenderness or bony deformity of patient's extremities, no joint effusions, compartments are soft, extremities are warm and well-perfused, no ecchymosis SKIN: Normal color for age and race; warm NEURO: No facial asymmetry, normal speech, moving all extremities equally, normal sensation  ED Results / Procedures / Treatments   LABS: (all labs ordered are listed, but only  abnormal results are displayed) Labs Reviewed  CBC WITH DIFFERENTIAL/PLATELET - Abnormal; Notable for the following components:      Result Value   RDW 15.7 (*)    Platelets 449 (*)    Lymphs Abs 5.1 (*)    All other components within normal limits  COMPREHENSIVE METABOLIC PANEL WITH GFR - Abnormal; Notable for the following components:   CO2 19 (*)    Glucose, Bld 124 (*)    Total Protein 8.2 (*)    Alkaline Phosphatase 34 (*)    All other components within normal limits  ETHANOL - Abnormal; Notable for the following components:   Alcohol, Ethyl (B) 102 (*)  All other components within normal limits  LIPASE, BLOOD  HCG, QUANTITATIVE, PREGNANCY     EKG:  EKG Interpretation Date/Time:  Sunday March 15 2024 01:29:37 EST Ventricular Rate:  109 PR Interval:  143 QRS Duration:  89 QT Interval:  330 QTC Calculation: 443 R Axis:   75  Text Interpretation: Sinus tachycardia ST elevation, consider inferior injury Confirmed by Neomi Neptune [phone]) on 03/15/2024 1:32:12 AM          RADIOLOGY: My personal review and interpretation of imaging: CT showed no acute traumatic injury.  I have personally reviewed all radiology reports. CT L-SPINE NO CHARGE Result Date: 03/15/2024 EXAM: CT OF THE LUMBAR SPINE WITHOUT CONTRAST 03/15/2024 03:52:55 AM TECHNIQUE: CT of the lumbar spine was performed without the administration of intravenous contrast. Multiplanar reformatted images are provided for review. Automated exposure control, iterative reconstruction, and/or weight based adjustment of the mA/kV was utilized to reduce the radiation dose to as low as reasonably achievable. COMPARISON: None available. CLINICAL HISTORY: FINDINGS: BONES AND ALIGNMENT: Normal vertebral body heights. No acute fracture or suspicious bone lesion. Normal alignment. DEGENERATIVE CHANGES: No significant degenerative changes. SOFT TISSUES: No acute abnormality. IMPRESSION: 1. Negative lumbar spine CT.  Electronically signed by: Pinkie Pebbles MD 03/15/2024 03:59 AM EST RP Workstation: [id number]   CT T-SPINE NO CHARGE Result Date: 03/15/2024 EXAM: CT THORACIC SPINE WITHOUT CONTRAST 03/15/2024 03:52:55 AM TECHNIQUE: CT of the thoracic spine was performed without the administration of intravenous contrast. Multiplanar reformatted images are provided for review. Automated exposure control, iterative reconstruction, and/or weight based adjustment of the mA/kV was utilized to reduce the radiation dose to as low as reasonably achievable. COMPARISON: None available. CLINICAL HISTORY: FINDINGS: BONES AND ALIGNMENT: Normal vertebral body heights. No acute fracture or suspicious bone lesion. Normal alignment. DEGENERATIVE CHANGES: Mild degenerative changes of the lower thoracic spine. SOFT TISSUES: No acute abnormality. IMPRESSION: 1. No acute abnormality of the thoracic spine. Electronically signed by: Pinkie Pebbles MD 03/15/2024 03:58 AM EST RP Workstation: [id number]   CT Cervical Spine Wo Contrast Result Date: 03/15/2024 EXAM: CT Cervical Spine Without Contrast 03/15/2024 03:52:55 AM TECHNIQUE: CT of the cervical spine was performed without the administration of intravenous contrast. Multiplanar reformatted images are provided for review. Automated exposure control, iterative reconstruction, and/or weight based adjustment of the mA/kV was utilized to reduce the radiation dose to as low as reasonably achievable. COMPARISON: None available. CLINICAL HISTORY: Neck trauma, intoxicated or obtunded (Age >= 16y) FINDINGS: BONES AND ALIGNMENT: No acute fracture or traumatic malalignment. DEGENERATIVE CHANGES: No significant degenerative changes. SOFT TISSUES: No prevertebral soft tissue swelling. IMPRESSION: 1. No acute abnormality of the cervical spine. Electronically signed by: Pinkie Pebbles MD 03/15/2024 03:57 AM EST RP Workstation: [id number]   CT HEAD WO CONTRAST ( ) Result Date: 03/15/2024 EXAM: CT HEAD  WITHOUT CONTRAST 03/15/2024 03:52:55 AM TECHNIQUE: CT of the head was performed without the administration of intravenous contrast. Automated exposure control, iterative reconstruction, and/or weight based adjustment of the mA/kV was utilized to reduce the radiation dose to as low as reasonably achievable. COMPARISON: None available. CLINICAL HISTORY: Head trauma, moderate-severe. FINDINGS: BRAIN AND VENTRICLES: No acute hemorrhage. No evidence of acute infarct. No hydrocephalus. No extra-axial collection. No mass effect or midline shift. ORBITS: No acute abnormality. SINUSES: Mild mucosal thickening of the ethmoid air cells. SOFT TISSUES AND SKULL: No acute soft tissue abnormality. No skull fracture. IMPRESSION: 1. No acute intracranial abnormality. Electronically signed by: Pinkie Pebbles MD 03/15/2024 03:57 AM EST RP Workstation: [id number]  CT CHEST ABDOMEN PELVIS W CONTRAST Result Date: 03/15/2024 EXAM: CT CHEST, ABDOMEN AND PELVIS WITH CONTRAST 03/15/2024 03:52:55 AM TECHNIQUE: CT of the chest, abdomen and pelvis was performed with the administration of 100 mL of iohexol (OMNIPAQUE) 300 MG/ML solution. Multiplanar reformatted images are provided for review. Automated exposure control, iterative reconstruction, and/or weight based adjustment of the mA/kV was utilized to reduce the radiation dose to as low as reasonably achievable. COMPARISON: None available. CLINICAL HISTORY: Fall on brick stairs with mid back pain, chest pain, abd pain; intoxicated. FINDINGS: CHEST: MEDIASTINUM AND LYMPH NODES: Heart and pericardium are unremarkable. The central airways are clear. No mediastinal, hilar or axillary lymphadenopathy. LUNGS AND PLEURA: No focal consolidation or pulmonary edema. No pleural effusion or pneumothorax. ABDOMEN AND PELVIS: LIVER: The liver is unremarkable. GALLBLADDER AND BILE DUCTS: Gallbladder is unremarkable. No biliary ductal dilatation. SPLEEN: No acute abnormality. PANCREAS: No acute  abnormality. ADRENAL GLANDS: No acute abnormality. KIDNEYS, URETERS AND BLADDER: No stones in the kidneys or ureters. No hydronephrosis. No perinephric or periureteral stranding. Urinary bladder is unremarkable. GI AND BOWEL: Postsurgical changes related to gastric sleeve. Stomach demonstrates no acute abnormality. There is no bowel obstruction. REPRODUCTIVE ORGANS: Uterus and bilateral ovaries are unremarkable. PERITONEUM AND RETROPERITONEUM: No ascites. No free air. VASCULATURE: Aorta is normal in caliber. ABDOMINAL AND PELVIS LYMPH NODES: No lymphadenopathy. BONES AND SOFT TISSUES: Mild degenerative changes of the mid/lower THORACIC spine. No acute osseous abnormality. No focal soft tissue abnormality. Dedicated thoracolumbar spine evaluation has been performed and will be reported separately. IMPRESSION: 1. No acute abnormality of the chest, abdomen, and pelvis. 2. Dedicated thoracolumbar spine evaluation has been performed and will be reported separately. Electronically signed by: Pinkie Pebbles MD 03/15/2024 03:56 AM EST RP Workstation: [id number]     PROCEDURES:  Critical Care performed: No     .1-3 Lead EKG Interpretation  Performed by: Braelen Sproule, Josette SAILOR, DO Authorized by: Joei Frangos, Josette SAILOR, DO     Interpretation: normal     ECG rate:  93   ECG rate assessment: normal     Rhythm: sinus rhythm     Ectopy: none     Conduction: normal       IMPRESSION / MDM / ASSESSMENT AND PLAN / ED COURSE  I reviewed the triage vital signs and the nursing notes.  Patient here with fall on wet stairs while intoxicated.  The patient is on the cardiac monitor to evaluate for evidence of arrhythmia and/or significant heart rate changes.   DIFFERENTIAL DIAGNOSIS (includes but not limited to):   Head injury, concussion, skull fracture, intracranial hemorrhage, cervical spine fracture, thoracic or lumbar fracture, rib fracture, pneumothorax, rib contusion, liver contusion or laceration, soft tissue  contusion and abrasion, alcohol intoxication  Patient's presentation is most consistent with acute presentation with potential threat to life or bodily function.  PLAN: Will obtain trauma CT imaging given patient has pain over her spine, ribs and abdomen.  Given she is intoxicated and not sure if she hit her head, will obtain CT head and cervical spine as I am unable to clear these areas clinically.  Will give Toradol for pain as I would like to avoid narcotics given patient is intoxicated at this time.  Will give IV fluids.  Hypertensive and tachycardic here likely secondary to pain.   MEDICATIONS GIVEN IN ED: Medications  sodium chloride 0.9 % bolus 1,000 mL (0 mLs Intravenous Stopped 03/15/24 0436)  ketorolac (TORADOL) 30 MG/ML injection 30 mg (30 mg Intravenous Given 03/15/24  0152)  iohexol (OMNIPAQUE) 300 MG/ML solution 100 mL (100 mLs Intravenous Contrast Given 03/15/24 0332)  traMADol (ULTRAM) tablet 50 mg (50 mg Oral Given 03/15/24 0536)  ondansetron  (ZOFRAN -ODT) disintegrating tablet 4 mg (4 mg Oral Given 03/15/24 0536)     ED COURSE: Patient reports significant improvement in pain after Toradol.  Blood pressure and heart rate have improved.  Alcohol level of 104.  Normal hemoglobin, electrolytes, negative pregnancy.  CTs reviewed and interpreted by myself and the radiologist and show no acute traumatic injury.  Patient able to ambulate here, tolerate p.o.  Clinically sober at this time, hemodynamically stable.  Will discharge with pain medication and instructions to rest, apply ice as needed.  Will provide with work note.   At this time, I do not feel there is any life-threatening condition present. I reviewed all nursing notes, vitals, pertinent previous records.  All lab and urine results, EKGs, imaging ordered have been independently reviewed and interpreted by myself.  I reviewed all available radiology reports from any imaging ordered this visit.  Based on my assessment, I feel the  patient is safe to be discharged home without further emergent workup and can continue workup as an outpatient as needed. Discussed all findings, treatment plan as well as usual and customary return precautions.  They verbalize understanding and are comfortable with this plan.  Outpatient follow-up has been provided as needed.  All questions have been answered.  CONSULTS:  none   OUTSIDE RECORDS REVIEWED: Reviewed recent family medicine notes.       FINAL CLINICAL IMPRESSION(S) / ED DIAGNOSES   Final diagnoses:  Fall, initial encounter  Alcoholic intoxication without complication  Acute back pain, unspecified back location, unspecified back pain laterality     Rx / DC Orders   ED Discharge Orders          Ordered    ibuprofen  (ADVIL ) 800 MG tablet  Every 8 hours PRN        03/15/24 0527    traMADol (ULTRAM) 50 MG tablet  Every 8 hours PRN        03/15/24 0527    ondansetron  (ZOFRAN -ODT) 4 MG disintegrating tablet  Every 8 hours PRN        03/15/24 0527             Note:  This document was prepared using Dragon voice recognition software and may include unintentional dictation errors.   Bettie Capistran, Josette SAILOR, DO 03/15/24 [phone]

## 2024-03-15 NOTE — ED Notes (Signed)
 Pt ambulated to bathroom and back to stretcher without assistance.

## 2024-03-15 NOTE — Discharge Instructions (Addendum)
